# Patient Record
Sex: Male | Born: 1965 | Race: Black or African American | Hispanic: No | Marital: Married | Smoking: Never smoker
Health system: Southern US, Community
[De-identification: ages and names within clinical notes are randomized; demographics above are authoritative.]

## PROBLEM LIST (undated history)

## (undated) DIAGNOSIS — E119 Type 2 diabetes mellitus without complications: Secondary | ICD-10-CM

## (undated) DIAGNOSIS — G4730 Sleep apnea, unspecified: Secondary | ICD-10-CM

## (undated) DIAGNOSIS — E785 Hyperlipidemia, unspecified: Secondary | ICD-10-CM

## (undated) DIAGNOSIS — E669 Obesity, unspecified: Secondary | ICD-10-CM

## (undated) DIAGNOSIS — I10 Essential (primary) hypertension: Secondary | ICD-10-CM

## (undated) DIAGNOSIS — Z8489 Family history of other specified conditions: Secondary | ICD-10-CM

## (undated) HISTORY — DX: Family history of other specified conditions: Z84.89

## (undated) HISTORY — DX: Hyperlipidemia, unspecified: E78.5

## (undated) HISTORY — PX: WISDOM TOOTH EXTRACTION: SHX21

## (undated) HISTORY — DX: Type 2 diabetes mellitus without complications: E11.9

## (undated) HISTORY — DX: Essential (primary) hypertension: I10

## (undated) HISTORY — DX: Obesity, unspecified: E66.9

## (undated) HISTORY — PX: COLONOSCOPY: SHX174

## (undated) HISTORY — DX: Sleep apnea, unspecified: G47.30

---

## 2005-11-18 ENCOUNTER — Ambulatory Visit: Payer: Self-pay | Admitting: Family Medicine

## 2005-11-25 ENCOUNTER — Ambulatory Visit: Payer: Self-pay | Admitting: Family Medicine

## 2005-12-23 ENCOUNTER — Ambulatory Visit: Payer: Self-pay | Admitting: Family Medicine

## 2006-02-02 ENCOUNTER — Ambulatory Visit: Payer: Self-pay | Admitting: Family Medicine

## 2006-02-04 ENCOUNTER — Ambulatory Visit: Payer: Self-pay

## 2006-03-17 ENCOUNTER — Ambulatory Visit: Payer: Self-pay | Admitting: Family Medicine

## 2006-11-18 ENCOUNTER — Ambulatory Visit: Payer: Self-pay | Admitting: Family Medicine

## 2006-11-18 LAB — CONVERTED CEMR LAB
ALT: 25 units/L (ref 0–40)
AST: 27 units/L (ref 0–37)
Albumin: 3.9 g/dL (ref 3.5–5.2)
Alkaline Phosphatase: 76 units/L (ref 39–117)
BUN: 10 mg/dL (ref 6–23)
Basophils Absolute: 0 10*3/uL (ref 0.0–0.1)
Basophils Relative: 0.3 % (ref 0.0–1.0)
Bilirubin, Direct: 0.1 mg/dL (ref 0.0–0.3)
CO2: 32 meq/L (ref 19–32)
Calcium: 9.3 mg/dL (ref 8.4–10.5)
Chloride: 102 meq/L (ref 96–112)
Cholesterol: 218 mg/dL (ref 0–200)
Creatinine, Ser: 1.1 mg/dL (ref 0.4–1.5)
Direct LDL: 158.5 mg/dL
Eosinophils Absolute: 0.3 10*3/uL (ref 0.0–0.6)
Eosinophils Relative: 5.5 % — ABNORMAL HIGH (ref 0.0–5.0)
GFR calc Af Amer: 95 mL/min
GFR calc non Af Amer: 78 mL/min
Glucose, Bld: 94 mg/dL (ref 70–99)
HCT: 40.3 % (ref 39.0–52.0)
HDL: 43.8 mg/dL (ref >39.0)
Hemoglobin: 13.4 g/dL (ref 13.0–17.0)
Lymphocytes Relative: 24.8 % (ref 12.0–46.0)
MCHC: 33.1 g/dL (ref 30.0–36.0)
MCV: 88.7 fL (ref 78.0–100.0)
Monocytes Absolute: 0.6 10*3/uL (ref 0.2–0.7)
Monocytes Relative: 9.6 % (ref 3.0–11.0)
Neutro Abs: 3.7 10*3/uL (ref 1.4–7.7)
Neutrophils Relative %: 59.8 % (ref 43.0–77.0)
Platelets: 279 10*3/uL (ref 150–400)
Potassium: 3.2 meq/L — ABNORMAL LOW (ref 3.5–5.1)
RBC: 4.54 M/uL (ref 4.22–5.81)
RDW: 13.1 % (ref 11.5–14.6)
Sodium: 143 meq/L (ref 135–145)
TSH: 3.24 microintl units/mL (ref 0.35–5.50)
Total Bilirubin: 0.6 mg/dL (ref 0.3–1.2)
Total CHOL/HDL Ratio: 5
Total Protein: 7.5 g/dL (ref 6.0–8.3)
Triglycerides: 71 mg/dL (ref 0–149)
VLDL: 14 mg/dL (ref 0–40)
WBC: 6.1 10*3/uL (ref 4.5–10.5)

## 2006-11-26 ENCOUNTER — Ambulatory Visit: Payer: Self-pay | Admitting: Family Medicine

## 2006-11-26 LAB — CONVERTED CEMR LAB
Hgb A1c MFr Bld: 7.8 % — ABNORMAL HIGH (ref 4.6–6.0)
PSA: 0.68 ng/mL (ref 0.10–4.00)

## 2007-01-28 ENCOUNTER — Ambulatory Visit: Payer: Self-pay | Admitting: Family Medicine

## 2007-01-28 LAB — CONVERTED CEMR LAB
ALT: 26 units/L (ref 0–40)
AST: 23 units/L (ref 0–37)
Albumin: 3.7 g/dL (ref 3.5–5.2)
Alkaline Phosphatase: 82 units/L (ref 39–117)
Bilirubin, Direct: 0.1 mg/dL (ref 0.0–0.3)
Cholesterol: 165 mg/dL (ref 0–200)
Creatinine,U: 147.5 mg/dL
HDL: 39.6 mg/dL (ref >39.0)
LDL Cholesterol: 112 mg/dL — ABNORMAL HIGH (ref 0–99)
Microalb Creat Ratio: 1.4 mg/g (ref 0.0–30.0)
Microalb, Ur: 0.2 mg/dL (ref 0.0–1.9)
Total Bilirubin: 0.6 mg/dL (ref 0.3–1.2)
Total CHOL/HDL Ratio: 4.2
Total Protein: 6.7 g/dL (ref 6.0–8.3)
Triglycerides: 67 mg/dL (ref 0–149)
VLDL: 13 mg/dL (ref 0–40)

## 2007-05-04 ENCOUNTER — Ambulatory Visit: Payer: Self-pay | Admitting: Family Medicine

## 2007-05-04 DIAGNOSIS — E139 Other specified diabetes mellitus without complications: Secondary | ICD-10-CM | POA: Insufficient documentation

## 2007-06-24 DIAGNOSIS — I10 Essential (primary) hypertension: Secondary | ICD-10-CM | POA: Insufficient documentation

## 2008-10-04 ENCOUNTER — Ambulatory Visit: Payer: Self-pay | Admitting: Family Medicine

## 2008-10-04 LAB — CONVERTED CEMR LAB
ALT: 29 units/L (ref 0–53)
Alkaline Phosphatase: 84 units/L (ref 39–117)
Basophils Absolute: 0 10*3/uL (ref 0.0–0.1)
Bilirubin Urine: NEGATIVE
Bilirubin, Direct: 0.1 mg/dL (ref 0.0–0.3)
Blood in Urine, dipstick: NEGATIVE
CO2: 34 meq/L — ABNORMAL HIGH (ref 19–32)
Calcium: 9.2 mg/dL (ref 8.4–10.5)
Chloride: 101 meq/L (ref 96–112)
Cholesterol: 215 mg/dL (ref 0–200)
Direct LDL: 152.9 mg/dL
Glucose, Urine, Semiquant: NEGATIVE
HDL: 42.1 mg/dL (ref >39.0)
Lymphocytes Relative: 26.5 % (ref 12.0–46.0)
MCHC: 34.2 g/dL (ref 30.0–36.0)
Microalb Creat Ratio: 2 mg/g (ref 0.0–30.0)
Microalb, Ur: 0.3 mg/dL (ref 0.0–1.9)
Monocytes Relative: 7.3 % (ref 3.0–12.0)
Neutro Abs: 3.3 10*3/uL (ref 1.4–7.7)
Neutrophils Relative %: 61.3 % (ref 43.0–77.0)
PSA: 0.68 ng/mL (ref 0.10–4.00)
Platelets: 212 10*3/uL (ref 150–400)
Potassium: 3.9 meq/L (ref 3.5–5.1)
Protein, U semiquant: NEGATIVE
RDW: 13.6 % (ref 11.5–14.6)
Sodium: 141 meq/L (ref 135–145)
TSH: 2.29 microintl units/mL (ref 0.35–5.50)
Total Bilirubin: 0.6 mg/dL (ref 0.3–1.2)
Total CHOL/HDL Ratio: 5.1
Triglycerides: 76 mg/dL (ref 0–149)
Urobilinogen, UA: 0.2
VLDL: 15 mg/dL (ref 0–40)
pH: 6

## 2008-10-11 ENCOUNTER — Ambulatory Visit: Payer: Self-pay | Admitting: Family Medicine

## 2008-10-11 DIAGNOSIS — E785 Hyperlipidemia, unspecified: Secondary | ICD-10-CM | POA: Insufficient documentation

## 2009-01-09 ENCOUNTER — Ambulatory Visit: Payer: Self-pay | Admitting: Family Medicine

## 2009-01-09 LAB — CONVERTED CEMR LAB
ALT: 31 units/L (ref 0–53)
AST: 24 units/L (ref 0–37)
Alkaline Phosphatase: 78 units/L (ref 39–117)
Total Bilirubin: 0.5 mg/dL (ref 0.3–1.2)
Total CHOL/HDL Ratio: 4
Triglycerides: 68 mg/dL (ref 0.0–149.0)

## 2009-01-16 ENCOUNTER — Ambulatory Visit: Payer: Self-pay | Admitting: Family Medicine

## 2009-01-29 ENCOUNTER — Telehealth: Payer: Self-pay | Admitting: Family Medicine

## 2009-02-13 ENCOUNTER — Ambulatory Visit: Payer: Self-pay | Admitting: Family Medicine

## 2009-05-16 ENCOUNTER — Ambulatory Visit: Payer: Self-pay | Admitting: Family Medicine

## 2009-05-16 LAB — CONVERTED CEMR LAB
BUN: 12 mg/dL (ref 6–23)
CO2: 30 meq/L (ref 19–32)
Chloride: 109 meq/L (ref 96–112)
Creatinine, Ser: 1.1 mg/dL (ref 0.4–1.5)

## 2009-05-23 ENCOUNTER — Ambulatory Visit: Payer: Self-pay | Admitting: Family Medicine

## 2009-10-11 ENCOUNTER — Ambulatory Visit: Payer: Self-pay | Admitting: Family Medicine

## 2009-10-11 LAB — CONVERTED CEMR LAB
AST: 21 units/L (ref 0–37)
Albumin: 3.8 g/dL (ref 3.5–5.2)
Alkaline Phosphatase: 70 units/L (ref 39–117)
Basophils Absolute: 0 10*3/uL (ref 0.0–0.1)
Blood in Urine, dipstick: NEGATIVE
Calcium: 8.7 mg/dL (ref 8.4–10.5)
Creatinine,U: 152 mg/dL
GFR calc non Af Amer: 104.35 mL/min (ref >60)
Glucose, Bld: 148 mg/dL — ABNORMAL HIGH (ref 70–99)
HDL: 43.4 mg/dL (ref >39.00)
Hemoglobin: 13 g/dL (ref 13.0–17.0)
Hgb A1c MFr Bld: 8.3 % — ABNORMAL HIGH (ref 4.6–6.5)
LDL Cholesterol: 135 mg/dL — ABNORMAL HIGH (ref 0–99)
Lymphocytes Relative: 26.4 % (ref 12.0–46.0)
Microalb Creat Ratio: 2.6 mg/g (ref 0.0–30.0)
Monocytes Relative: 8.8 % (ref 3.0–12.0)
Neutro Abs: 3.3 10*3/uL (ref 1.4–7.7)
Nitrite: NEGATIVE
Protein, U semiquant: NEGATIVE
RBC: 4.38 M/uL (ref 4.22–5.81)
RDW: 13.9 % (ref 11.5–14.6)
Sodium: 135 meq/L (ref 135–145)
Total Bilirubin: 0.5 mg/dL (ref 0.3–1.2)
Total CHOL/HDL Ratio: 4
Urobilinogen, UA: 0.2
VLDL: 16 mg/dL (ref 0.0–40.0)
WBC Urine, dipstick: NEGATIVE

## 2009-10-23 ENCOUNTER — Ambulatory Visit: Payer: Self-pay | Admitting: Family Medicine

## 2009-11-06 ENCOUNTER — Ambulatory Visit: Payer: Self-pay | Admitting: Family Medicine

## 2010-01-29 ENCOUNTER — Ambulatory Visit: Payer: Self-pay | Admitting: Family Medicine

## 2010-01-29 LAB — CONVERTED CEMR LAB
GFR calc non Af Amer: 104.21 mL/min (ref >60)
Hgb A1c MFr Bld: 8.7 % — ABNORMAL HIGH (ref 4.6–6.5)
Potassium: 4.1 meq/L (ref 3.5–5.1)
Sodium: 141 meq/L (ref 135–145)

## 2010-02-05 ENCOUNTER — Ambulatory Visit: Payer: Self-pay | Admitting: Family Medicine

## 2010-02-13 ENCOUNTER — Ambulatory Visit: Payer: Self-pay | Admitting: Family Medicine

## 2010-04-09 LAB — HM DIABETES EYE EXAM

## 2010-04-10 ENCOUNTER — Encounter: Payer: Self-pay | Admitting: Family Medicine

## 2010-05-07 ENCOUNTER — Ambulatory Visit: Payer: Self-pay | Admitting: Family Medicine

## 2010-05-07 LAB — CONVERTED CEMR LAB
Chloride: 103 meq/L (ref 96–112)
Hgb A1c MFr Bld: 6.3 % (ref 4.6–6.5)
Potassium: 4 meq/L (ref 3.5–5.1)
Sodium: 139 meq/L (ref 135–145)

## 2010-05-15 ENCOUNTER — Ambulatory Visit: Payer: Self-pay | Admitting: Family Medicine

## 2010-10-02 ENCOUNTER — Ambulatory Visit
Admission: RE | Admit: 2010-10-02 | Discharge: 2010-10-02 | Payer: Self-pay | Source: Home / Self Care | Attending: Family Medicine | Admitting: Family Medicine

## 2010-10-02 ENCOUNTER — Other Ambulatory Visit: Payer: Self-pay | Admitting: Family Medicine

## 2010-10-02 LAB — MICROALBUMIN / CREATININE URINE RATIO
Creatinine,U: 183.8 mg/dL
Microalb Creat Ratio: 1.2 mg/g (ref 0.0–30.0)
Microalb, Ur: 2.2 mg/dL — ABNORMAL HIGH (ref 0.0–1.9)

## 2010-10-02 LAB — CONVERTED CEMR LAB
Bilirubin Urine: NEGATIVE
Ketones, urine, test strip: NEGATIVE
Protein, U semiquant: NEGATIVE
Urobilinogen, UA: 0.2

## 2010-10-02 LAB — CBC WITH DIFFERENTIAL/PLATELET
Basophils Absolute: 0 10*3/uL (ref 0.0–0.1)
Basophils Relative: 0.2 % (ref 0.0–3.0)
Eosinophils Absolute: 0.3 10*3/uL (ref 0.0–0.7)
Eosinophils Relative: 6.6 % — ABNORMAL HIGH (ref 0.0–5.0)
HCT: 43.2 % (ref 39.0–52.0)
Hemoglobin: 14.5 g/dL (ref 13.0–17.0)
Lymphocytes Relative: 26.2 % (ref 12.0–46.0)
Lymphs Abs: 1.4 10*3/uL (ref 0.7–4.0)
MCHC: 33.6 g/dL (ref 30.0–36.0)
MCV: 89.7 fl (ref 78.0–100.0)
Monocytes Absolute: 0.5 10*3/uL (ref 0.1–1.0)
Monocytes Relative: 9 % (ref 3.0–12.0)
Neutro Abs: 3 10*3/uL (ref 1.4–7.7)
Neutrophils Relative %: 58 % (ref 43.0–77.0)
Platelets: 233 10*3/uL (ref 150.0–400.0)
RBC: 4.82 Mil/uL (ref 4.22–5.81)
RDW: 15 % — ABNORMAL HIGH (ref 11.5–14.6)
WBC: 5.2 10*3/uL (ref 4.5–10.5)

## 2010-10-02 LAB — LIPID PANEL
Cholesterol: 246 mg/dL — ABNORMAL HIGH (ref 0–200)
HDL: 48.2 mg/dL (ref >39.00)
Total CHOL/HDL Ratio: 5
Triglycerides: 68 mg/dL (ref 0.0–149.0)
VLDL: 13.6 mg/dL (ref 0.0–40.0)

## 2010-10-02 LAB — TSH: TSH: 2.78 u[IU]/mL (ref 0.35–5.50)

## 2010-10-02 LAB — HEPATIC FUNCTION PANEL
ALT: 38 U/L (ref 0–53)
AST: 32 U/L (ref 0–37)
Albumin: 3.9 g/dL (ref 3.5–5.2)
Alkaline Phosphatase: 71 U/L (ref 39–117)
Bilirubin, Direct: 0.1 mg/dL (ref 0.0–0.3)
Total Bilirubin: 0.6 mg/dL (ref 0.3–1.2)
Total Protein: 7.5 g/dL (ref 6.0–8.3)

## 2010-10-02 LAB — BASIC METABOLIC PANEL
BUN: 20 mg/dL (ref 6–23)
CO2: 28 mEq/L (ref 19–32)
Calcium: 9.2 mg/dL (ref 8.4–10.5)
Chloride: 103 mEq/L (ref 96–112)
Creatinine, Ser: 1.1 mg/dL (ref 0.4–1.5)
GFR: 98.2 mL/min (ref >60.00)
Glucose, Bld: 95 mg/dL (ref 70–99)
Potassium: 4.5 mEq/L (ref 3.5–5.1)
Sodium: 138 mEq/L (ref 135–145)

## 2010-10-02 LAB — LDL CHOLESTEROL, DIRECT: Direct LDL: 183.5 mg/dL

## 2010-10-02 LAB — HEMOGLOBIN A1C: Hgb A1c MFr Bld: 6.4 % (ref 4.6–6.5)

## 2010-10-08 ENCOUNTER — Encounter: Payer: Self-pay | Admitting: Family Medicine

## 2010-10-08 ENCOUNTER — Ambulatory Visit
Admission: RE | Admit: 2010-10-08 | Discharge: 2010-10-08 | Payer: Self-pay | Source: Home / Self Care | Attending: Family Medicine | Admitting: Family Medicine

## 2010-10-29 NOTE — Assessment & Plan Note (Signed)
Summary: cpx/njr/pt rescd from [facility] per dr//ccm   Vital Signs:  Patient profile:   45 year old male Height:      70.75 inches Weight:      300 pounds BMI:     42.29 Temp:     98.9 degrees F oral BP sitting:   120 / 84  (left arm) Cuff size:   large  Vitals Entered By: Kern Reap CMA Duncan Dull) (October 23, 2009 4:38 PM)  Reason for Visit cpx    History of Present Illness: Leonard Rojas is a 45 year old male, nonsmoker, who comes in today for physical evaluation because of underlying diabetes.  He has been controlling his blood sugar with diet and exercise.  However, recently, he stopped this time.  Weight is up to 300 pounds.  Blood sugar fasting 148 with a hemoglobin A1c of 8.3.  He also has a history of hyperlipidemia.  He got severe muscle pain from Lipitor and Zocor.  He can tolerate 10 mg of Crestor daily.  He stopped taking the Hyzaar because his blood pressure normalized today.  BP 120/84.  His ophthalmologist has left town recommended.  Dr. Vonna Kotyk  he gets routine  dental care.  Tetanus 2004 seasonal flu shot today  Allergies: No Known Drug Allergies  Past History:  Past medical, surgical, family and social histories (including risk factors) reviewed, and no changes noted (except as noted below).  Past Medical History: Reviewed history from 10/11/2008 and no changes required. Obese Allergies +TB Skin Test Diabetes mellitus, type II Hypertension Hyperlipidemia  Past Surgical History: Reviewed history from 06/24/2007 and no changes required. Denies surgical history  Family History: Reviewed history from 06/24/2007 and no changes required. Family History Diabetes 1st degree relative Family History of Prostate CA 1st degree relative <50  Social History: Reviewed history from 06/24/2007 and no changes required. Occupation: Married Never Smoked Alcohol use-yes Drug use-no Regular exercise-no  Review of Systems      See HPI  Physical Exam  General:   Well-developed,well-nourished,in no acute distress; alert,appropriate and cooperative throughout examination Head:  Normocephalic and atraumatic without obvious abnormalities. No apparent alopecia or balding. Eyes:  No corneal or conjunctival inflammation noted. EOMI. Perrla. Funduscopic exam benign, without hemorrhages, exudates or papilledema. Vision grossly normal. Ears:  External ear exam shows no significant lesions or deformities.  Otoscopic examination reveals clear canals, tympanic membranes are intact bilaterally without bulging, retraction, inflammation or discharge. Hearing is grossly normal bilaterally. Nose:  External nasal examination shows no deformity or inflammation. Nasal mucosa are pink and moist without lesions or exudates. Mouth:  Oral mucosa and oropharynx without lesions or exudates.  Teeth in good repair. Neck:  No deformities, masses, or tenderness noted. Chest Wall:  No deformities, masses, tenderness or gynecomastia noted. Breasts:  No masses or gynecomastia noted Lungs:  Normal respiratory effort, chest expands symmetrically. Lungs are clear to auscultation, no crackles or wheezes. Heart:  Normal rate and regular rhythm. S1 and S2 normal without gallop, murmur, click, rub or other extra sounds. Abdomen:  Bowel sounds positive,abdomen soft and non-tender without masses, organomegaly or hernias noted. Rectal:  No external abnormalities noted. Normal sphincter tone. No rectal masses or tenderness. Genitalia:  Testes bilaterally descended without nodularity, tenderness or masses. No scrotal masses or lesions. No penis lesions or urethral discharge. Prostate:  Prostate gland firm and smooth, no enlargement, nodularity, tenderness, mass, asymmetry or induration. Msk:  No deformity or scoliosis noted of thoracic or lumbar spine.   Pulses:  R and L carotid,radial,femoral,dorsalis  pedis and posterior tibial pulses are full and equal bilaterally Extremities:  No clubbing, cyanosis,  edema, or deformity noted with normal full range of motion of all joints.   Neurologic:  No cranial nerve deficits noted. Station and gait are normal. Plantar reflexes are down-going bilaterally. DTRs are symmetrical throughout. Sensory, motor and coordinative functions appear intact.  Diabetes Management Exam:    Foot Exam (with socks and/or shoes not present):       Sensory-Pinprick/Light touch:          Left medial foot (L-4): normal          Left dorsal foot (L-5): normal          Left lateral foot (S-1): normal          Right medial foot (L-4): normal          Right dorsal foot (L-5): normal          Right lateral foot (S-1): normal       Sensory-Monofilament:          Left foot: normal          Right foot: normal       Inspection:          Left foot: normal          Right foot: normal       Nails:          Left foot: normal          Right foot: normal    Eye Exam:       Eye Exam done elsewhere          Date: 10/14/2007          Results: normal          Done by: opth   Impression & Recommendations:  Problem # 1:  HYPERLIPIDEMIA (ICD-272.4) Assessment Improved  The following medications were removed from the medication list:    Crestor 10 Mg Tabs (Rosuvastatin calcium) .Marland Kitchen... 1 tab @ bedtime His updated medication list for this problem includes:    Crestor 10 Mg Tabs (Rosuvastatin calcium) .Marland Kitchen... 1 tab @ bedtime  Orders: Prescription Created Electronically [id number])  Problem # 2:  HYPERTENSION (ICD-401.9) Assessment: Improved  The following medications were removed from the medication list:    Hyzaar 50-12.5 Mg Tabs (Losartan potassium-hctz) .Marland Kitchen... Take 1 tablet by mouth every morning His updated medication list for this problem includes:    Zestril 5 Mg Tabs (Lisinopril) .Marland Kitchen... Take 1 tablet by mouth every morning  Orders: Prescription Created Electronically [id number])  Problem # 3:  DIABETES MELLITUS, TYPE II (ICD-250.00) Assessment: Deteriorated  The following  medications were removed from the medication list:    Hyzaar 50-12.5 Mg Tabs (Losartan potassium-hctz) .Marland Kitchen... Take 1 tablet by mouth every morning His updated medication list for this problem includes:    Metformin Hcl 500 Mg Tabs (Metformin hcl) .Marland Kitchen... Take 1 tablet by mouth two times a day    Zestril 5 Mg Tabs (Lisinopril) .Marland Kitchen... Take 1 tablet by mouth every morning  Orders: Prescription Created Electronically [id number])  Complete Medication List: 1)  Onetouch Ultra Test Strp (Glucose blood) .... Use daily to test glucose 2)  Metformin Hcl 500 Mg Tabs (Metformin hcl) .... Take 1 tablet by mouth two times a day 3)  Zestril 5 Mg Tabs (Lisinopril) .... Take 1 tablet by mouth every morning 4)  Crestor 10 Mg Tabs (Rosuvastatin calcium) .Marland Kitchen.. 1 tab @ bedtime  Patient Instructions: 1)  See your eye doctor yearly to check for diabetic eye damage. 2)  restart your caloric restricted diet, as u did before.  Drank 30 ounces of water daily restart metformin 500 mg twice a day.  Check a fasting blood sugar daily.  Return in two weeks for follow-up. 3)  Call Dr. Gweneth Dimitri for eye exam. 4)  Walk 15 minutes daily 5)  Take Zestril, 5 mg daily for renal protection 6)  Take an Aspirin every day. 7)  Check your blood sugars regularly. If your readings are usually above : or below 70 you should contact our office. 8)  It is important that your Diabetic A1c level is checked every 3 months. 9)  See your eye doctor yearly to check for diabetic eye damage. 10)  Check your feet each night for sore areas, calluses or signs of infection. 11)  Check your Blood Pressure regularly. If it is above: you should make an appointment. Prescriptions: ONETOUCH ULTRA TEST  STRP (GLUCOSE BLOOD) use daily to test glucose  #100 x 3   Entered and Authorized by:   Roderick Pee MD   Signed by:   Roderick Pee MD on 10/23/2009   Method used:   Electronically to        [facility]  [address] [phone]* (retail)       [address]        [place], [place]  [address]       Ph: [phone] or [phone]       Fax: [phone]   RxID:   [id number] CRESTOR 10 MG TABS (ROSUVASTATIN CALCIUM) 1 tab @ bedtime  #100 x 3   Entered and Authorized by:   Roderick Pee MD   Signed by:   Roderick Pee MD on 10/23/2009   Method used:   Electronically to        [facility]  [address] [phone]* (retail)       [address]       [place], [place]  [address]       Ph: [phone] or [phone]       Fax: [phone]   RxID:   [id number] ZESTRIL 5 MG TABS (LISINOPRIL) Take 1 tablet by mouth every morning  #100 x 3   Entered and Authorized by:   Roderick Pee MD   Signed by:   Roderick Pee MD on 10/23/2009   Method used:   [facility]ronically to        [facility]  [address] [phone]* (retail)       [address]       [place], [place]  [address]       Ph: [phone] or [phone]       Fax: [phone]   RxID:   [id number] METFORMIN HCL 500 MG TABS (METFORMIN HCL) Take 1 tablet by mouth two times a day  #200 x 3   Entered and Authorized by:   Roderick Pee MD   Signed by:   Roderick Pee MD on 10/23/2009   Method used:   [facility]ronically to        [facility]  [address] [phone]* (retail)       [address]       [place], [place]  [address]  Ph: [phone] or [phone]       Fax: [phone]   RxID:   [id number]     Appended Document: cpx/njr/pt rescd from [facility] per dr//ccm     Allergies: No Known Drug Allergies  Review of Systems       Flu Vaccine Consent Questions     Do you have a history of severe allergic reactions to this vaccine? no    Any prior history of allergic reactions to egg and/or gelatin? no    Do you have a sensitivity to the preservative Thimersol? no    Do you have a past history of Guillan-Barre Syndrome? no    Do you currently have an acute febrile illness? no    Have you ever had a severe reaction to latex? no     Vaccine information given and explained to patient? yes    Are you currently pregnant? no    Lot Number:[id number]   Exp Date:03/28/2010   Site Given  Left Deltoid IM    Complete Medication List: 1)  Onetouch Ultra Test Strp (Glucose blood) .... Use daily to test glucose 2)  Metformin Hcl 500 Mg Tabs (Metformin hcl) .... Take 1 tablet by mouth two times a day 3)  Zestril 5 Mg Tabs (Lisinopril) .... Take 1 tablet by mouth every morning 4)  Crestor 10 Mg Tabs (Rosuvastatin calcium) .Marland Kitchen.. 1 tab @ bedtime  Other Orders: Admin 1st Vaccine (31517) Flu Vaccine 37yrs + ([address]07) EKG w/ Interpretation (93000)

## 2010-10-29 NOTE — Assessment & Plan Note (Signed)
Summary: 1 week fup//ccm   Vital Signs:  Patient profile:   45 year old male Weight:      296 pounds Temp:     98.0 degrees F oral BP sitting:   108 / 80  (left arm) Cuff size:   large  Vitals Entered By: Kern Reap CMA Duncan Dull) (Feb 13, 2010 8:40 AM) CC: 1 week follow up   CC:  1 week follow up.  History of Present Illness: Oday is a 45 year old male type II diabetic, who comes in today for follow-up.  His metformin doses 500 mg b.i.d.  He had a fasting blood sugar two weeks ago.  113, however, A1c was 8.  In the, meantime he's been checking his blood sugar two to 3 times a day and are all under 120.  He is got died at exercise and this is why his blood sugar has dropped to normal.  No hypoglycemia  Allergies (verified): No Known Drug Allergies  Social History: Reviewed history from 06/24/2007 and no changes required. Occupation: Married Never Smoked Alcohol use-yes Drug use-no Regular exercise-no  Review of Systems      See HPI  Physical Exam  General:  Well-developed,well-nourished,in no acute distress; alert,appropriate and cooperative throughout examination   Impression & Recommendations:  Problem # 1:  DIABETES MELLITUS, TYPE II (ICD-250.00) Assessment Improved  The following medications were removed from the medication list:    Glucophage 1000 Mg Tabs (Metformin hcl) .Marland Kitchen... Take 1 tablet by mouth every morning His updated medication list for this problem includes:    Metformin Hcl 500 Mg Tabs (Metformin hcl) .Marland Kitchen... Take 1 tablet by mouth two times a day    Zestril 5 Mg Tabs (Lisinopril) .Marland Kitchen... Take 1 tablet by mouth every morning  Complete Medication List: 1)  Onetouch Ultra Test Strp (Glucose blood) .... Use daily to test glucose 2)  Metformin Hcl 500 Mg Tabs (Metformin hcl) .... Take 1 tablet by mouth two times a day 3)  Zestril 5 Mg Tabs (Lisinopril) .... Take 1 tablet by mouth every morning 4)  Crestor 10 Mg Tabs (Rosuvastatin calcium) .Marland Kitchen.. 1 tab @  bedtime  Patient Instructions: 1)  continue your current treatment program.  Check a fasting blood sugar once daily in the morning. 2)  Please schedule a follow-up appointment in 3 months. 3)  See your eye doctor yearly to check for diabetic eye damage. 4)  BMP prior to visit, ICD-9: 5)  HbgA1C prior to visit, ICD-9:

## 2010-10-29 NOTE — Assessment & Plan Note (Signed)
Summary: 3 mo rov/mm   Vital Signs:  Patient profile:   45 year old male Weight:      295 pounds Temp:     98.2 degrees F oral BP sitting:   110 / 80  (left arm) CC: FU / A1C    CC:  FU / A1C.  History of Present Illness: Leonard Rojas is a 45 year old, married male, nonsmoker, with underlying type 2 diabetes, who comes in today for follow-up.  His fasting blood sugar is around 120 to 130 however, his hemoglobin A1c is 8.7.  In the last 3 weeks since he joined, [employer] she's lost 5 pounds and is drastically altered his diet.  Prior to that he was eating exclusively fast food.  Blood pressure 110/80.  Weight 295.  He was advised to get his annual eye exam.  Denies any blurring of his vision  Allergies: No Known Drug Allergies  Past History:  Past medical, surgical, family and social histories (including risk factors) reviewed for relevance to current acute and chronic problems.  Past Medical History: Reviewed history from 10/11/2008 and no changes required. Obese Allergies +TB Skin Test Diabetes mellitus, type II Hypertension Hyperlipidemia  Past Surgical History: Reviewed history from 06/24/2007 and no changes required. Denies surgical history  Family History: Reviewed history from 06/24/2007 and no changes required. Family History Diabetes 1st degree relative Family History of Prostate CA 1st degree relative <50  Social History: Reviewed history from 06/24/2007 and no changes required. Occupation: Married Never Smoked Alcohol use-yes Drug use-no Regular exercise-no  Review of Systems      See HPI  Physical Exam  General:  Well-developed,well-nourished,in no acute distress; alert,appropriate and cooperative throughout examination   Impression & Recommendations:  Problem # 1:  DIABETES MELLITUS, TYPE II (ICD-250.00) Assessment Deteriorated  His updated medication list for this problem includes:    Metformin Hcl 500 Mg Tabs (Metformin hcl) .Marland Kitchen... Take  1 tablet by mouth two times a day    Zestril 5 Mg Tabs (Lisinopril) .Marland Kitchen... Take 1 tablet by mouth every morning    Glucophage 1000 Mg Tabs (Metformin hcl) .Marland Kitchen... Take 1 tablet by mouth every morning  Orders: Prescription Created Electronically [phone])  Complete Medication List: 1)  Onetouch Ultra Test Strp (Glucose blood) .... Use daily to test glucose 2)  Metformin Hcl 500 Mg Tabs (Metformin hcl) .... Take 1 tablet by mouth two times a day 3)  Zestril 5 Mg Tabs (Lisinopril) .... Take 1 tablet by mouth every morning 4)  Crestor 10 Mg Tabs (Rosuvastatin calcium) .Marland Kitchen.. 1 tab @ bedtime 5)  Glucophage 1000 Mg Tabs (Metformin hcl) .... Take 1 tablet by mouth every morning  Patient Instructions: 1)  increase the morning dose of Glucophage to 1000 mg and keep the evening.  This is same at 500. 2)  Check a blood sugar in the morning, noon, and  prior to evening meal...........Marland Kitchenreturn in one week for follow-up Prescriptions: GLUCOPHAGE 1000 MG TABS (METFORMIN HCL) Take 1 tablet by mouth every morning  #100 x 3   Entered and Authorized by:   Roderick Pee MD   Signed by:   Roderick Pee MD on 02/05/2010   Method used:   [facility]ron[facility] to        [facility]  [address] [phone]* (retail)       [address]       [place], [place]  [address]  Ph: [phone] or [phone]       Fax: [phone]   RxID:   [id number]

## 2010-10-29 NOTE — Assessment & Plan Note (Signed)
Summary: 3 month fup//ccm   Vital Signs:  Patient profile:   45 year old male Weight:      275 pounds Temp:     98.3 degrees F oral BP sitting:   110 / 80  (left arm) Cuff size:   regular  Vitals Entered By: Kern Reap CMA Duncan Dull) (May 15, 2010 8:31 AM)  CC: follow-up visit   CC:  follow-up visit.  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  In May, his hemoglobin A1c was 8.6%.  We talked about various options.  He elected to try diet and exercise.  He's lost 20 pounds and he stopped his medication and his blood sugar now is normal and his A1c is 6.3%.  He's been off the metformin now for 3 months.  Is also stop the Crestor because of muscle pain.  He states he tried Zocor, Lipitor, all these medications caused muscle pain.  We will get a follow-up lipid panel in 3 months.  He continues to only take Zestril 5 mg daily.  BP 110/80.  Allergies (verified): No Known Drug Allergies  Past History:  Past medical, surgical, family and social histories (including risk factors) reviewed for relevance to current acute and chronic problems.  Past Medical History: Reviewed history from 10/11/2008 and no changes required. Obese Allergies +TB Skin Test Diabetes mellitus, type II Hypertension Hyperlipidemia  Past Surgical History: Reviewed history from 06/24/2007 and no changes required. Denies surgical history  Family History: Reviewed history from 06/24/2007 and no changes required. Family History Diabetes 1st degree relative Family History of Prostate CA 1st degree relative <50  Social History: Reviewed history from 06/24/2007 and no changes required. Occupation: Married Never Smoked Alcohol use-yes Drug use-no Regular exercise-no  Review of Systems      See HPI  Physical Exam  General:  Well-developed,well-nourished,in no acute distress;  alert,appropriate and cooperative throughout examination   Impression & Recommendations:  Problem # 1:  HYPERLIPIDEMIA (ICD-272.4) Assessment Improved  The following medications were removed from the medication list:    Crestor 10 Mg Tabs (Rosuvastatin calcium) .Marland Kitchen... 1 tab @ bedtime  Problem # 2:  DIABETES MELLITUS, TYPE II (ICD-250.00) Assessment: Improved  The following medications were removed from the medication list:    Metformin Hcl 500 Mg Tabs (Metformin hcl) .Marland Kitchen... Take 1 tablet by mouth two times a day His updated medication list for this problem includes:    Zestril 5 Mg Tabs (Lisinopril) .Marland Kitchen... 1/2 qam  Problem # 3:  HYPERTENSION (ICD-401.9) Assessment: Improved  His updated medication list for this problem includes:    Zestril 5 Mg Tabs (Lisinopril) .Marland Kitchen... 1/2 qam  Complete Medication List: 1)  Onetouch Ultra Test Strp (Glucose blood) .... Use daily to test glucose 2)  Zestril 5 Mg Tabs (Lisinopril) .... 1/2 qam  Patient Instructions: 1)  check a fasting blood sugar, Monday, Wednesday, Friday, and decrease the Zestril to a half of a 5-mg q.a.m. 2)  Please schedule a follow-up appointment in 3 months.   ....250.00 3)  BMP prior to visit, ICD-9: 4)  Lipid Panel prior to visit, ICD-9: 5)  HbgA1C prior to visit, ICD-9: Prescriptions: ZESTRIL 5 MG TABS (LISINOPRIL) 1/2 qam  #50 x 3   Entered and Authorized by:   Roderick Pee MD   Signed by:   Roderick Pee MD on 05/15/2010   Method used:   [facility] to  [facility]  [address] [phone]* (retail)       [address]       [place], [place]  [address]       Ph: [phone] or [phone]       Fax: [phone]   RxID:   [id number]

## 2010-10-29 NOTE — Assessment & Plan Note (Signed)
Summary: 2 wk rov/njr   Vital Signs:  Patient profile:   45 year old male Weight:      300 pounds Temp:     99.1 degrees F oral BP sitting:   120 / 80  (left arm) Cuff size:   large  Vitals Entered By: Kern Reap CMA Duncan Dull) (November 06, 2009 3:11 PM)  Reason for Visit follow up glucose  History of Present Illness: Leonard Rojas is a 45 year old male, married, nonsmoker, who comes in today for evaluation of diabetes, new onset type II.  Two weeks ago.  We started him on metformin 500 b.i.d. blood sugars now are averaging right around 100.  No hypoglycemia.  We also added Zestril 5 mg daily for renal protection.  Allergies: No Known Drug Allergies  Past History:  Past medical, surgical, family and social histories (including risk factors) reviewed for relevance to current acute and chronic problems.  Past Medical History: Reviewed history from 10/11/2008 and no changes required. Obese Allergies +TB Skin Test Diabetes mellitus, type II Hypertension Hyperlipidemia  Past Surgical History: Reviewed history from 06/24/2007 and no changes required. Denies surgical history  Family History: Reviewed history from 06/24/2007 and no changes required. Family History Diabetes 1st degree relative Family History of Prostate CA 1st degree relative <50  Social History: Reviewed history from 06/24/2007 and no changes required. Occupation: Married Never Smoked Alcohol use-yes Drug use-no Regular exercise-no  Review of Systems      See HPI  Physical Exam  General:  Well-developed,well-nourished,in no acute distress; alert,appropriate and cooperative throughout examination   Impression & Recommendations:  Problem # 1:  DIABETES MELLITUS, TYPE II (ICD-250.00) Assessment Improved  His updated medication list for this problem includes:    Metformin Hcl 500 Mg Tabs (Metformin hcl) .Marland Kitchen... Take 1 tablet by mouth two times a day    Zestril 5 Mg Tabs (Lisinopril) .Marland Kitchen... Take 1 tablet  by mouth every morning  Complete Medication List: 1)  Onetouch Ultra Test Strp (Glucose blood) .... Use daily to test glucose 2)  Metformin Hcl 500 Mg Tabs (Metformin hcl) .... Take 1 tablet by mouth two times a day 3)  Zestril 5 Mg Tabs (Lisinopril) .... Take 1 tablet by mouth every morning 4)  Crestor 10 Mg Tabs (Rosuvastatin calcium) .Marland Kitchen.. 1 tab @ bedtime  Patient Instructions: 1)  continue current medications.  Remember to walk 20 minutes daily. 2)  If we need to fine tune your medication call Fleet Contras and leave her voicemail. 3)  Please schedule a follow-up appointment in 3 months......250.00 4)  BMP prior to visit, ICD-9: 5)  HbgA1C prior to visit, ICD-9:

## 2010-10-29 NOTE — Miscellaneous (Signed)
Summary: eye exam update  Clinical Lists Changes  Observations: Added new observation of EYES COMMENT: 03/2011 (04/10/2010 9:10) Added new observation of EYE EXAM BY: sally miller (04/02/2010 9:11) Added new observation of DMEYEEXMRES: normal (04/02/2010 9:11) Added new observation of DIAB EYE EX: normal (04/02/2010 9:11)      Diabetes Management History:      He says that he is not exercising regularly.    Diabetes Management Exam:    Eye Exam:       Eye Exam done elsewhere          Date: 04/02/2010          Results: normal          Done by: Blima Ledger

## 2010-10-31 NOTE — Assessment & Plan Note (Signed)
Summary: 3 month fup//ccm----PT [facility] // RS   Vital Signs:  Patient profile:   45 year old male Height:      70.75 inches Weight:      285 pounds BMI:     40.18 Temp:     98.8 degrees F oral BP sitting:   120 / 90  (left arm) Cuff size:   regular  Vitals Entered By: Kern Reap CMA Duncan Dull) (October 08, 2010 3:00 PM)  CC: cpx Is Patient Diabetic? Yes Did you bring your meter with you today? No Pain Assessment Patient in pain? no        CC:  cpx.  History of Present Illness: Leonard Rojas is a 45 year old, married male, nonsmoker, who comes in today for general medical examination.  In the, past.  He's had elevated blood sugars.  Blood sugar now 95.  A1c6.5.  BP at home 120/80.  He stopped the Zestril because his blood pressure had dropped to low.  Review of systems otherwise negative.  Tetanus booster 2004 seasonal flu shot 2011  Allergies: No Known Drug Allergies  Past History:  Past medical, surgical, family and social histories (including risk factors) reviewed, and no changes noted (except as noted below).  Past Medical History: Reviewed history from 10/11/2008 and no changes required. Obese Allergies +TB Skin Test Diabetes mellitus, type II Hypertension Hyperlipidemia  Past Surgical History: Reviewed history from 06/24/2007 and no changes required. Denies surgical history  Family History: Reviewed history from 06/24/2007 and no changes required. Family History Diabetes 1st degree relative Family History of Prostate CA 1st degree relative <50  Social History: Reviewed history from 06/24/2007 and no changes required. Occupation: Married Never Smoked Alcohol use-yes Drug use-no Regular exercise-no  Review of Systems      See HPI  Physical Exam  General:  Well-developed,well-nourished,in no acute distress; alert,appropriate and cooperative throughout examination Head:  Normocephalic and atraumatic without obvious abnormalities. No apparent alopecia or  balding. Eyes:  No corneal or conjunctival inflammation noted. EOMI. Perrla. Funduscopic exam benign, without hemorrhages, exudates or papilledema. Vision grossly normal. Ears:  External ear exam shows no significant lesions or deformities.  Otoscopic examination reveals clear canals, tympanic membranes are intact bilaterally without bulging, retraction, inflammation or discharge. Hearing is grossly normal bilaterally. Nose:  External nasal examination shows no deformity or inflammation. Nasal mucosa are pink and moist without lesions or exudates. Mouth:  Oral mucosa and oropharynx without lesions or exudates.  Teeth in good repair. Neck:  No deformities, masses, or tenderness noted. Chest Wall:  No deformities, masses, tenderness or gynecomastia noted. Breasts:  No masses or gynecomastia noted Lungs:  Normal respiratory effort, chest expands symmetrically. Lungs are clear to auscultation, no crackles or wheezes. Heart:  Normal rate and regular rhythm. S1 and S2 normal without gallop, murmur, click, rub or other extra sounds. Abdomen:  Bowel sounds positive,abdomen soft and non-tender without masses, organomegaly or hernias noted. Rectal:  No external abnormalities noted. Normal sphincter tone. No rectal masses or tenderness. Genitalia:  Testes bilaterally descended without nodularity, tenderness or masses. No scrotal masses or lesions. No penis lesions or urethral discharge. Prostate:  Prostate gland firm and smooth, no enlargement, nodularity, tenderness, mass, asymmetry or induration. Msk:  No deformity or scoliosis noted of thoracic or lumbar spine.   Pulses:  R and L carotid,radial,femoral,dorsalis pedis and posterior tibial pulses are full and equal bilaterally Extremities:  No clubbing, cyanosis, edema, or deformity noted with normal full range of motion of all joints.   Neurologic:  No cranial nerve deficits noted. Station and gait are normal. Plantar reflexes are down-going bilaterally.  DTRs are symmetrical throughout. Sensory, motor and coordinative functions appear intact. Skin:  Intact without suspicious lesions or rashes Cervical Nodes:  No lymphadenopathy noted Axillary Nodes:  No palpable lymphadenopathy Inguinal Nodes:  No significant adenopathy Psych:  Cognition and judgment appear intact. Alert and cooperative with normal attention span and concentration. No apparent delusions, illusions, hallucinations  Diabetes Management Exam:    Foot Exam (with socks and/or shoes not present):       Sensory-Pinprick/Light touch:          Left medial foot (L-4): normal          Left dorsal foot (L-5): normal          Left lateral foot (S-1): normal          Right medial foot (L-4): normal          Right dorsal foot (L-5): normal          Right lateral foot (S-1): normal       Sensory-Monofilament:          Left foot: normal          Right foot: normal       Inspection:          Left foot: normal          Right foot: normal       Nails:          Left foot: normal          Right foot: normal    Eye Exam:       Eye Exam done elsewhere          Date: 04/09/2010          Results: normal          Done by: opth   Impression & Recommendations:  Problem # 1:  HYPERLIPIDEMIA (ICD-272.4) Assessment Improved  Orders: EKG w/ Interpretation (93000)  Problem # 2:  Preventive Health Care (ICD-V70.0) Assessment: Unchanged  Problem # 3:  DIABETES MELLITUS, TYPE II (ICD-250.00) Assessment: Improved  The following medications were removed from the medication list:    Zestril 5 Mg Tabs (Lisinopril) .Marland Kitchen... 1/2 qam  Complete Medication List: 1)  Onetouch Ultra Test Strp (Glucose blood) .... Use daily to test glucose  Patient Instructions: 1)  resume your diet and exercise program. 2)  Please schedule a follow-up appointment in 3 months.....250.00,272.00 3)  BMP prior to visit, ICD-9: 4)  Lipid Panel prior to visit, ICD-9: 5)  HbgA1C prior to visit, ICD-9: 6)  Take an  Aspirin every day.   Orders Added: 1)  Est. Patient 40-64 years [99396] 2)  EKG w/ Interpretation [93000]

## 2011-01-07 ENCOUNTER — Other Ambulatory Visit (INDEPENDENT_AMBULATORY_CARE_PROVIDER_SITE_OTHER): Payer: Federal, State, Local not specified - PPO | Admitting: Family Medicine

## 2011-01-07 DIAGNOSIS — E119 Type 2 diabetes mellitus without complications: Secondary | ICD-10-CM

## 2011-01-07 DIAGNOSIS — E785 Hyperlipidemia, unspecified: Secondary | ICD-10-CM

## 2011-01-07 LAB — LIPID PANEL
HDL: 52.2 mg/dL (ref >39.00)
Total CHOL/HDL Ratio: 4
VLDL: 15.4 mg/dL (ref 0.0–40.0)

## 2011-01-07 LAB — BASIC METABOLIC PANEL
CO2: 30 mEq/L (ref 19–32)
Chloride: 103 mEq/L (ref 96–112)
Glucose, Bld: 103 mg/dL — ABNORMAL HIGH (ref 70–99)
Potassium: 4.7 mEq/L (ref 3.5–5.1)
Sodium: 142 mEq/L (ref 135–145)

## 2011-01-07 LAB — LDL CHOLESTEROL, DIRECT: Direct LDL: 155.7 mg/dL

## 2011-01-07 LAB — HEMOGLOBIN A1C: Hgb A1c MFr Bld: 7.1 % — ABNORMAL HIGH (ref 4.6–6.5)

## 2011-01-13 ENCOUNTER — Encounter: Payer: Self-pay | Admitting: Family Medicine

## 2011-01-14 ENCOUNTER — Ambulatory Visit (INDEPENDENT_AMBULATORY_CARE_PROVIDER_SITE_OTHER): Payer: Federal, State, Local not specified - PPO | Admitting: Family Medicine

## 2011-01-14 ENCOUNTER — Encounter: Payer: Self-pay | Admitting: Family Medicine

## 2011-01-14 DIAGNOSIS — E785 Hyperlipidemia, unspecified: Secondary | ICD-10-CM

## 2011-01-14 DIAGNOSIS — E119 Type 2 diabetes mellitus without complications: Secondary | ICD-10-CM

## 2011-01-14 DIAGNOSIS — I10 Essential (primary) hypertension: Secondary | ICD-10-CM

## 2011-01-14 MED ORDER — ATORVASTATIN CALCIUM 10 MG PO TABS: 10.0000 mg | ORAL_TABLET | Freq: Every day | ORAL | Status: DC

## 2011-01-14 MED ORDER — METFORMIN HCL 500 MG PO TABS
ORAL_TABLET | ORAL | Status: DC
Start: 2011-01-14 — End: 2011-09-18

## 2011-01-14 MED ORDER — LISINOPRIL 10 MG PO TABS: 10.0000 mg | ORAL_TABLET | Freq: Every day | ORAL | Status: DC

## 2011-01-14 NOTE — Patient Instructions (Signed)
Begin the 10 mg of the ACE inhibitor daily in the morning,,,,,,,,,,,, remember potential complications of hives, and cough.  Baby aspirin, one nightly with your Lipitor, 10 mg.  Metformin 500 mg,,,,,,,,,,,,, one half tablet daily in the morning prior to breakfast.  Check a fasting blood sugar daily.  Restart your diet and exercise program.  Follow-up in two months fasting labs one week prior

## 2011-01-14 NOTE — Progress Notes (Signed)
  Subjective:    Patient ID: Leonard Rojas, male    DOB: [birth year], 45 y.o.   MRN: [id number]  HPIThomas is a 45 year old, married male, nonsmoker, who comes in today for follow-up of the metabolic syndrome.  His fasting blood sugar now is 103, and A1c7.1.  Lipids are elevated with a LDL of 157 and his BP diastolic jumped to 90 systolic 130.  He, states he's not fine.  His diet and exercise program, which in the past controlled all his metabolic parameters.  However, I recommend we get him started on medication.      Review of Systems General and metabolic review of systems otherwise negative    Objective:   Physical Exam Well-developed well-nourished, male in no acute distress       Assessment & Plan:  Diabetes type II.  Begin metformin 500 mg prior to breakfast.  Hypertension.  Begin ACE inhibitor 10 mg q.a.m.  Hyperlipidemia.  Lipitor 10 nightly with a baby aspirin, followed metabolic parameters and two months

## 2011-03-12 ENCOUNTER — Other Ambulatory Visit: Payer: Federal, State, Local not specified - PPO

## 2011-03-18 ENCOUNTER — Ambulatory Visit: Payer: Federal, State, Local not specified - PPO | Admitting: Family Medicine

## 2011-07-01 ENCOUNTER — Other Ambulatory Visit: Payer: Federal, State, Local not specified - PPO

## 2011-07-08 ENCOUNTER — Ambulatory Visit: Payer: Federal, State, Local not specified - PPO | Admitting: Family Medicine

## 2011-09-04 ENCOUNTER — Other Ambulatory Visit (INDEPENDENT_AMBULATORY_CARE_PROVIDER_SITE_OTHER): Payer: BC Managed Care – PPO

## 2011-09-04 DIAGNOSIS — Z0000 Encounter for general adult medical examination without abnormal findings: Secondary | ICD-10-CM

## 2011-09-04 LAB — POCT URINALYSIS DIPSTICK
Bilirubin, UA: NEGATIVE
Blood, UA: NEGATIVE
Ketones, UA: NEGATIVE
Nitrite, UA: NEGATIVE
Spec Grav, UA: 1.02
pH, UA: 5.5

## 2011-09-04 LAB — PSA: PSA: 0.65 ng/mL (ref 0.10–4.00)

## 2011-09-04 LAB — LIPID PANEL
Cholesterol: 218 mg/dL — ABNORMAL HIGH (ref 0–200)
Triglycerides: 94 mg/dL (ref 0.0–149.0)

## 2011-09-04 LAB — HEPATIC FUNCTION PANEL
Alkaline Phosphatase: 88 U/L (ref 39–117)
Bilirubin, Direct: 0.1 mg/dL (ref 0.0–0.3)
Total Bilirubin: 0.3 mg/dL (ref 0.3–1.2)
Total Protein: 7.1 g/dL (ref 6.0–8.3)

## 2011-09-04 LAB — MICROALBUMIN / CREATININE URINE RATIO
Creatinine,U: 153.7 mg/dL
Microalb Creat Ratio: 0.3 mg/g (ref 0.0–30.0)
Microalb, Ur: 0.4 mg/dL (ref 0.0–1.9)

## 2011-09-04 LAB — CBC WITH DIFFERENTIAL/PLATELET
Basophils Relative: 0.2 % (ref 0.0–3.0)
Eosinophils Absolute: 0.3 10*3/uL (ref 0.0–0.7)
Lymphocytes Relative: 21.3 % (ref 12.0–46.0)
MCHC: 33.1 g/dL (ref 30.0–36.0)
Monocytes Absolute: 0.4 10*3/uL (ref 0.1–1.0)
Neutrophils Relative %: 68 % (ref 43.0–77.0)
Platelets: 223 10*3/uL (ref 150.0–400.0)
RBC: 4.58 Mil/uL (ref 4.22–5.81)
WBC: 6.8 10*3/uL (ref 4.5–10.5)

## 2011-09-04 LAB — BASIC METABOLIC PANEL
BUN: 13 mg/dL (ref 6–23)
CO2: 27 mEq/L (ref 19–32)
Calcium: 8.9 mg/dL (ref 8.4–10.5)
Creatinine, Ser: 1 mg/dL (ref 0.4–1.5)

## 2011-09-04 LAB — LDL CHOLESTEROL, DIRECT: Direct LDL: 157.2 mg/dL

## 2011-09-18 ENCOUNTER — Ambulatory Visit (INDEPENDENT_AMBULATORY_CARE_PROVIDER_SITE_OTHER): Payer: BC Managed Care – PPO | Admitting: Family Medicine

## 2011-09-18 ENCOUNTER — Encounter: Payer: Self-pay | Admitting: Family Medicine

## 2011-09-18 DIAGNOSIS — Z23 Encounter for immunization: Secondary | ICD-10-CM

## 2011-09-18 DIAGNOSIS — E785 Hyperlipidemia, unspecified: Secondary | ICD-10-CM

## 2011-09-18 DIAGNOSIS — I10 Essential (primary) hypertension: Secondary | ICD-10-CM

## 2011-09-18 DIAGNOSIS — E119 Type 2 diabetes mellitus without complications: Secondary | ICD-10-CM

## 2011-09-18 MED ORDER — GLUCOSE BLOOD VI STRP: ORAL_STRIP | Status: AC

## 2011-09-18 MED ORDER — ATORVASTATIN CALCIUM 10 MG PO TABS: 10.0000 mg | ORAL_TABLET | Freq: Every day | ORAL | Status: DC

## 2011-09-18 MED ORDER — METFORMIN HCL 500 MG PO TABS: ORAL_TABLET | ORAL | Status: DC

## 2011-09-18 MED ORDER — LISINOPRIL 10 MG PO TABS: 10.0000 mg | ORAL_TABLET | Freq: Every day | ORAL | Status: DC

## 2011-09-18 NOTE — Progress Notes (Signed)
  Subjective:    Patient ID: Leonard Rojas, male    DOB: 1966-08-12, 45 y.o.   MRN: [id number]  HPI Leonard Rojas is a 45 year old, married male, nonsmoker, who comes in today for evaluation of diabetes, hyperlipidemia, and hypertension.  His weight is up to 301 pounds, and he has stopped taking his medicine 3 months ago.  His fasting blood sugar is 188 with an A1c of 11%.  Microalbumin is negative.  Blood pressure is normal at 118/80.   Review of Systems  Constitutional: Negative.   HENT: Negative.   Eyes: Negative.   Respiratory: Negative.   Cardiovascular: Negative.   Gastrointestinal: Negative.   Genitourinary: Negative.   Musculoskeletal: Negative.   Skin: Negative.   Neurological: Negative.   Hematological: Negative.   Psychiatric/Behavioral: Negative.        Objective:   Physical Exam  Constitutional: He is oriented to person, place, and time. He appears well-developed and well-nourished.  HENT:  Head: Normocephalic and atraumatic.  Right Ear: External ear normal.  Left Ear: External ear normal.  Nose: Nose normal.  Mouth/Throat: Oropharynx is clear and moist.  Eyes: Conjunctivae and EOM are normal. Pupils are equal, round, and reactive to light.       Some blurred vision  Neck: Normal range of motion. Neck supple. No JVD present. No tracheal deviation present. No thyromegaly present.  Cardiovascular: Normal rate, regular rhythm, normal heart sounds and intact distal pulses.  Exam reveals no gallop and no friction rub.   No murmur heard. Pulmonary/Chest: Effort normal and breath sounds normal. No stridor. No respiratory distress. He has no wheezes. He has no rales. He exhibits no tenderness.  Abdominal: Soft. Bowel sounds are normal. He exhibits no distension and no mass. There is no tenderness. There is no rebound and no guarding.  Genitourinary: Rectum normal, prostate normal and penis normal. Guaiac negative stool. No penile tenderness.  Musculoskeletal: Normal range of  motion. He exhibits no edema and no tenderness.  Lymphadenopathy:    He has no cervical adenopathy.  Neurological: He is alert and oriented to person, place, and time. He has normal reflexes. No cranial nerve deficit. He exhibits normal muscle tone.       No neuropathy  Skin: Skin is warm and dry. No rash noted. No erythema. No pallor.  Psychiatric: He has a normal mood and affect. His behavior is normal. Judgment and thought content normal.          Assessment & Plan:  Diabetes type II.  Plan restart medication and emphasized the importance of taking medication on a daily basis.  In the future.  Careful follow-up

## 2011-09-18 NOTE — Patient Instructions (Signed)
start your medication today.  Check a fasting blood sugar daily in the morning.  Return in 3 weeks for follow-up with all your blood sugar readings

## 2011-10-09 ENCOUNTER — Ambulatory Visit: Payer: BC Managed Care – PPO | Admitting: Family Medicine

## 2011-10-14 ENCOUNTER — Encounter: Payer: Self-pay | Admitting: Family Medicine

## 2011-10-14 ENCOUNTER — Ambulatory Visit (INDEPENDENT_AMBULATORY_CARE_PROVIDER_SITE_OTHER): Payer: Federal, State, Local not specified - PPO | Admitting: Family Medicine

## 2011-10-14 DIAGNOSIS — F988 Other specified behavioral and emotional disorders with onset usually occurring in childhood and adolescence: Secondary | ICD-10-CM | POA: Insufficient documentation

## 2011-10-14 DIAGNOSIS — E119 Type 2 diabetes mellitus without complications: Secondary | ICD-10-CM

## 2011-10-14 MED ORDER — AMPHETAMINE-DEXTROAMPHETAMINE 10 MG PO TABS: 10.0000 mg | ORAL_TABLET | Freq: Two times a day (BID) | ORAL | Status: DC

## 2011-10-14 NOTE — Progress Notes (Signed)
  Subjective:    Patient ID: Leonard Rojas, male    DOB: 23-Sep-1966, 46 y.o.   MRN: [id number]  HPI Lewi is a [age]-year-old, married male, nonsmoker, who comes in today for follow-up of two problems.  We saw him a couple weeks ago, and his blood sugar was elevated in the 200 range.  We started him on metformin 500 mg daily, and he's increased the dose to 500 mg twice daily.  No blood sugars are in the 120 range.  No hypoglycemia.  He is having some mild GI side effects from the medication, but no abdominal pain, nor diarrhea.  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  Also, his son has recently been diagnosed with ADD.  Academically, he had a 3.3, in high school and went to [employer] on academic scholarship and had a 3.2 GPA.  He then went to dental school, but did not pass.  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  He states his main problem is focus and concentration in getting things done and getting organized.   Review of Systems    General metabolic and psychiatric review of systems otherwise negative Objective:   Physical Exam Well-developed well-nourished, overweight male in no acute distress       Assessment & Plan:  Diabetes type 2, in improved control continue metformin 500 b.i.d., nutrition consult for diet, exercise and weight loss, mid, follow-up in 3 weeks.  Trial of Adderall 10 mg b.i.d. Follow-up in 3 weeks

## 2011-10-14 NOTE — Patient Instructions (Signed)
Continue the metformin 500 mg twice daily, however, it if you would like to see if it would lessen your GI side effects.  Decrease the evening dose to 250 mg.  Start the Adderall 10 mg twice daily.  We will set to you and Fleet Contras up for a nutrition consult to talk about diet, etc.  Return in 3 weeks for follow-up.  Continue to check a fasting blood sugar daily in the morning

## 2011-11-05 ENCOUNTER — Encounter: Payer: Self-pay | Admitting: Family Medicine

## 2011-11-05 ENCOUNTER — Ambulatory Visit (INDEPENDENT_AMBULATORY_CARE_PROVIDER_SITE_OTHER): Payer: Federal, State, Local not specified - PPO | Admitting: Family Medicine

## 2011-11-05 DIAGNOSIS — E119 Type 2 diabetes mellitus without complications: Secondary | ICD-10-CM

## 2011-11-05 DIAGNOSIS — E785 Hyperlipidemia, unspecified: Secondary | ICD-10-CM

## 2011-11-05 DIAGNOSIS — I10 Essential (primary) hypertension: Secondary | ICD-10-CM

## 2011-11-05 NOTE — Patient Instructions (Signed)
Continue your current medications  Continue your diet and walk 20 minutes daily  Followup the fourth week in March,,,,,,,,,, nonfasting labs one week prior

## 2011-11-05 NOTE — Progress Notes (Signed)
  Subjective:    Patient ID: Leonard Rojas, male    DOB: 04-22-1966, 46 y.o.   MRN: [id number]  HPI [name] is a 46 year old married male nonsmoker who comes in today for followup of diabetes  In early December of 2012 his A1c had gone up to 11. He restarted his diet and exercise program. We maintained his metformin 500 mg twice a day now blood sugar is in the 90-120 range.  Blood pressure normal 120/84  When his blood sugar went up also his LDL cholesterol went up. He's currently taking Lipitor 10 mg daily. We'll followup on lipid panel since blood sugar is stable   Review of Systems General and metabolic review of systems otherwise negative    Objective:   Physical Exam Well-developed well-nourished male in no acute distress       Assessment & Plan:  Diabetes type 2 at goal continue current therapy followup in 6 weeks labs one week prior  Hypertension continue lisinopril 10 mg daily  Hyperlipidemia continue Lipitor 10 mg each bedtime and a baby aspirin will

## 2011-11-17 ENCOUNTER — Other Ambulatory Visit: Payer: Federal, State, Local not specified - PPO

## 2011-12-15 ENCOUNTER — Other Ambulatory Visit: Payer: Federal, State, Local not specified - PPO

## 2011-12-23 ENCOUNTER — Ambulatory Visit: Payer: Federal, State, Local not specified - PPO | Admitting: Family Medicine

## 2011-12-31 ENCOUNTER — Encounter: Payer: Self-pay | Admitting: *Deleted

## 2011-12-31 ENCOUNTER — Encounter: Payer: Federal, State, Local not specified - PPO | Attending: Family Medicine | Admitting: *Deleted

## 2011-12-31 VITALS — Ht 71.0 in | Wt 294.5 lb

## 2011-12-31 DIAGNOSIS — E119 Type 2 diabetes mellitus without complications: Secondary | ICD-10-CM | POA: Insufficient documentation

## 2011-12-31 DIAGNOSIS — Z713 Dietary counseling and surveillance: Secondary | ICD-10-CM | POA: Insufficient documentation

## 2011-12-31 NOTE — Patient Instructions (Addendum)
Goals:  Follow Diabetes Meal Plan as instructed (see yellow card).  Eat 3 meals and 2 snacks, every 3-5 hrs  Add lean protein foods to meals/snacks  Monitor glucose levels as instructed by your doctor  Bring glucose log to your next nutrition visit  Aim for 30-60 mins of physical activity daily  Limit regular soda, sweet tea, and fast food.

## 2011-12-31 NOTE — Progress Notes (Signed)
Medical Nutrition Therapy:  Appt start time: 1630   End time:  1730.  Assessment:  Primary concerns today: T2DM.  Pt here with recent A1c of 11% for assessment of T2DM. States he checks BGs only 8-9 times/month because he "doesn't want to know what his sugar is".  Reports most recent fasting BGs were 100-130 mg. Food recall reveals highly excessive CHO, fat, and sodium daily from fast food intake, including regular soda. Pt is familiar with CHO counting and states no need for help with this. States he has a weight goal of 215 lbs. No consistent physical activity or pain noted at this time.   MEDICATIONS: See medication list. Reconciled with pt.    DIETARY INTAKE:  Usual eating pattern includes 3 meals and 1-2 snacks per day.  24-hr recall:  B ( AM): Sausage biscuit, hash browns, sweet tea  Snk ( AM): None  L ( PM): Burger, fries, sweet tea or reg soda Snk ( PM): Fruit or pretzels or chips D ( PM): Chicken (baked or broiled), salad or broccoli or rice; Pepsi Max or Sprite Zero Snk ( PM): Fruit   Usual physical activity: None  Estimated energy needs: 1500-1700 calories 170-190 g carbohydrates 110-125 g protein 40-50 g fat  Progress Towards Goal(s):  In progress.   Nutritional Diagnosis:  [place]-2.1 Impaired nutrient utilization related to glucose metabolism as evidenced by recent A1c of 11% and excessive CHO intake.    Intervention:  Nutrition education.  Goals:  Follow Diabetes Meal Plan as instructed (see yellow card).  Eat 3 meals and 2 snacks, every 3-5 hrs  Add lean protein foods to meals/snacks  Monitor glucose levels as instructed by your doctor  Bring glucose log to your next nutrition visit  Aim for 30-60 mins of physical activity daily  Limit regular soda, sweet tea, and fast food.  Handouts given during visit include:  Living Well with Diabetes - [employer]  Low CHO snack list  30g, 60g CHO menus  Monitoring/Evaluation:  Dietary intake, exercise, A1c, BG  trends, and body weight in 6 week(s).

## 2012-01-01 ENCOUNTER — Other Ambulatory Visit: Payer: Self-pay | Admitting: Family Medicine

## 2012-01-01 ENCOUNTER — Encounter: Payer: Self-pay | Admitting: *Deleted

## 2012-01-01 DIAGNOSIS — F988 Other specified behavioral and emotional disorders with onset usually occurring in childhood and adolescence: Secondary | ICD-10-CM

## 2012-01-01 MED ORDER — AMPHETAMINE-DEXTROAMPHETAMINE 10 MG PO TABS: 10.0000 mg | ORAL_TABLET | Freq: Two times a day (BID) | ORAL | Status: DC

## 2012-01-01 NOTE — Telephone Encounter (Signed)
Rx ready for pick up.  Left message on machine for patient. 

## 2012-01-01 NOTE — Telephone Encounter (Signed)
Pt need new rx generic adderall 10 mg twice a day. Pt has bloodwork appt tomorrow afternoon. Pt would like to pick up rx tomorrow

## 2012-01-02 ENCOUNTER — Other Ambulatory Visit (INDEPENDENT_AMBULATORY_CARE_PROVIDER_SITE_OTHER): Payer: Federal, State, Local not specified - PPO

## 2012-01-02 DIAGNOSIS — E119 Type 2 diabetes mellitus without complications: Secondary | ICD-10-CM

## 2012-01-02 DIAGNOSIS — E785 Hyperlipidemia, unspecified: Secondary | ICD-10-CM

## 2012-01-02 NOTE — Progress Notes (Signed)
Addended by: Rita Ohara R on: 01/02/2012 04:09 PM   Modules accepted: Orders

## 2012-01-03 LAB — LIPID PANEL
LDL Cholesterol: 118 mg/dL — ABNORMAL HIGH (ref 0–99)
Triglycerides: 115 mg/dL (ref <150)
VLDL: 23 mg/dL (ref 0–40)

## 2012-01-03 LAB — BASIC METABOLIC PANEL
BUN: 18 mg/dL (ref 6–23)
CO2: 26 mEq/L (ref 19–32)
Calcium: 9.5 mg/dL (ref 8.4–10.5)
Creat: 0.88 mg/dL (ref 0.50–1.35)
Glucose, Bld: 117 mg/dL — ABNORMAL HIGH (ref 70–99)

## 2012-01-03 LAB — HEMOGLOBIN A1C: Hgb A1c MFr Bld: 8 % — ABNORMAL HIGH (ref <5.7)

## 2012-01-06 ENCOUNTER — Other Ambulatory Visit: Payer: Self-pay | Admitting: *Deleted

## 2012-01-06 NOTE — Telephone Encounter (Signed)
Left message on machine for patient to call and schedule an appointment

## 2012-01-27 ENCOUNTER — Ambulatory Visit: Payer: Federal, State, Local not specified - PPO | Admitting: Family Medicine

## 2012-01-28 ENCOUNTER — Ambulatory Visit: Payer: Federal, State, Local not specified - PPO | Admitting: Family Medicine

## 2012-02-03 ENCOUNTER — Encounter: Payer: Self-pay | Admitting: Family Medicine

## 2012-02-03 ENCOUNTER — Ambulatory Visit (INDEPENDENT_AMBULATORY_CARE_PROVIDER_SITE_OTHER): Payer: Federal, State, Local not specified - PPO | Admitting: Family Medicine

## 2012-02-03 VITALS — BP 120/80 | Temp 98.1°F | Wt 292.0 lb

## 2012-02-03 DIAGNOSIS — E119 Type 2 diabetes mellitus without complications: Secondary | ICD-10-CM

## 2012-02-03 DIAGNOSIS — E785 Hyperlipidemia, unspecified: Secondary | ICD-10-CM

## 2012-02-03 DIAGNOSIS — I10 Essential (primary) hypertension: Secondary | ICD-10-CM

## 2012-02-03 NOTE — Patient Instructions (Signed)
Continue your current treatment program  Check a fasting blood sugar Monday Wednesday Friday since your blood sugar is back to normal  Set up a followup appointment in 2 months  Nonfasting lab work 1 week prior

## 2012-02-03 NOTE — Progress Notes (Signed)
  Subjective:    Patient ID: Leonard Rojas, male    DOB: [birth year], 46 y.o.   MRN: [id number]  HPI Leonard Rojas is a 46 year old married male nonsmoker who comes in today for evaluation of diabetes hypertension and hyperlipidemia  He went to see a nutritionist and male his blood sugar has dropped from the 190 range down to 117. A1c 5 months ago was 11 mass down to 8.0. Lipids have dropped but not back to normal. He's taking Lipitor 10 mg daily. LDL is 118 goal less than 75. Blood pressure is 120/80  He has cut his metformin down to 500 mg once daily because 2 pills a day causes his blood sugar to be too low. He thinks the diet and exercise has been of great benefit   Review of Systems Gen. metabolic review of systems otherwise negative    Objective:   Physical Exam Well-developed well-nourished male in no acute distress       Assessment & Plan:  Diabetes type 2 improved control continue metformin 1 daily diet and exercise followup in 2 months  Hypertension at goal  Hyperlipidemia approaching goal continue Lipitor 10 mg daily

## 2012-02-11 ENCOUNTER — Encounter: Payer: Federal, State, Local not specified - PPO | Attending: Family Medicine | Admitting: *Deleted

## 2012-02-11 ENCOUNTER — Encounter: Payer: Self-pay | Admitting: *Deleted

## 2012-02-11 VITALS — Ht 71.0 in | Wt 286.8 lb

## 2012-02-11 DIAGNOSIS — Z713 Dietary counseling and surveillance: Secondary | ICD-10-CM | POA: Insufficient documentation

## 2012-02-11 DIAGNOSIS — E119 Type 2 diabetes mellitus without complications: Secondary | ICD-10-CM | POA: Insufficient documentation

## 2012-02-11 NOTE — Patient Instructions (Signed)
Goals:  Continue previous goals.   Follow up prn.

## 2012-02-11 NOTE — Progress Notes (Signed)
Medical Nutrition Therapy:  Appt start time: 1630   End time:  1700.  Primary concerns today: T2DM; F/U.  Pt here with spouse for f/u. Recent A1c (12/2011) of 8.0% noted in EPIC. Pt has decreased sodas, high fat foods, and sweets. Decreased portions of food as well.  Has lost 7.5 lbs since last visit (12/31/11). Doing very well and reports no problems.   MEDICATIONS: See medication list. Reconciled with pt.    Usual physical activity: Working in the yard; joined gym  Estimated energy needs: 1500-1700 calories 170-190 g carbohydrates 110-125 g protein 40-50 g fat  Progress Towards Goal(s):  Resolved.   Nutritional Diagnosis:  None    Intervention:  Nutrition education prn.  Goals:  Continue previous goals.   Follow up prn.  Monitoring/Evaluation:  Dietary intake, exercise, A1c, BG trends, and body weight prn.

## 2012-03-31 ENCOUNTER — Other Ambulatory Visit (INDEPENDENT_AMBULATORY_CARE_PROVIDER_SITE_OTHER): Payer: Federal, State, Local not specified - PPO

## 2012-03-31 DIAGNOSIS — E119 Type 2 diabetes mellitus without complications: Secondary | ICD-10-CM

## 2012-03-31 DIAGNOSIS — E785 Hyperlipidemia, unspecified: Secondary | ICD-10-CM

## 2012-03-31 LAB — LIPID PANEL
Cholesterol: 172 mg/dL (ref 0–200)
LDL Cholesterol: 104 mg/dL — ABNORMAL HIGH (ref 0–99)
Triglycerides: 87 mg/dL (ref 0.0–149.0)

## 2012-03-31 LAB — BASIC METABOLIC PANEL
BUN: 13 mg/dL (ref 6–23)
CO2: 26 mEq/L (ref 19–32)
Chloride: 102 mEq/L (ref 96–112)
GFR: 104.41 mL/min (ref >60.00)
Glucose, Bld: 118 mg/dL — ABNORMAL HIGH (ref 70–99)
Potassium: 3.7 mEq/L (ref 3.5–5.1)

## 2012-04-08 ENCOUNTER — Ambulatory Visit (INDEPENDENT_AMBULATORY_CARE_PROVIDER_SITE_OTHER): Payer: Federal, State, Local not specified - PPO | Admitting: Family Medicine

## 2012-04-08 ENCOUNTER — Encounter: Payer: Self-pay | Admitting: Family Medicine

## 2012-04-08 VITALS — BP 110/80 | Temp 98.3°F | Wt 296.0 lb

## 2012-04-08 DIAGNOSIS — E119 Type 2 diabetes mellitus without complications: Secondary | ICD-10-CM

## 2012-04-08 NOTE — Patient Instructions (Signed)
Continue your current medications  Begin a daily walking program  Return in December for your annual exam labs one week prior

## 2012-04-08 NOTE — Progress Notes (Signed)
  Subjective:    Patient ID: VERON SENNER, male    DOB: 1965-12-08, 46 y.o.   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 who comes in today for followup of diabetes type 2  He's currently on metformin 500 mg daily and his A1c is 6.7% fasting blood sugar 118  He's also Lipitor 10 mg daily lipids are at goal no hypoglycemia   Review of Systems General and metabolic review of systems otherwise negative except she's not exercising daily    Objective:   Physical Exam  Well-developed well-nourished male no acute distress weight still up to 96 BP 110/80      Assessment & Plan:  Diabetes type 2 at goal continue current therapy followup in December  Hyperlipidemia goal continue Lipitor followup in December  Overweight and inactive begin daily walking program

## 2012-09-15 ENCOUNTER — Other Ambulatory Visit (INDEPENDENT_AMBULATORY_CARE_PROVIDER_SITE_OTHER): Payer: Federal, State, Local not specified - PPO

## 2012-09-15 DIAGNOSIS — Z0000 Encounter for general adult medical examination without abnormal findings: Secondary | ICD-10-CM

## 2012-09-15 DIAGNOSIS — E119 Type 2 diabetes mellitus without complications: Secondary | ICD-10-CM

## 2012-09-15 LAB — CBC WITH DIFFERENTIAL/PLATELET
Basophils Absolute: 0 10*3/uL (ref 0.0–0.1)
Basophils Relative: 0.3 % (ref 0.0–3.0)
Eosinophils Absolute: 0.4 10*3/uL (ref 0.0–0.7)
Eosinophils Relative: 6.2 % — ABNORMAL HIGH (ref 0.0–5.0)
HCT: 41.3 % (ref 39.0–52.0)
Hemoglobin: 13.5 g/dL (ref 13.0–17.0)
Lymphocytes Relative: 25.3 % (ref 12.0–46.0)
Lymphs Abs: 1.7 10*3/uL (ref 0.7–4.0)
MCHC: 32.7 g/dL (ref 30.0–36.0)
MCV: 87.2 fl (ref 78.0–100.0)
Monocytes Absolute: 0.5 10*3/uL (ref 0.1–1.0)
Monocytes Relative: 7.5 % (ref 3.0–12.0)
Neutro Abs: 4 10*3/uL (ref 1.4–7.7)
Neutrophils Relative %: 60.7 % (ref 43.0–77.0)
Platelets: 241 10*3/uL (ref 150.0–400.0)
RBC: 4.74 Mil/uL (ref 4.22–5.81)
RDW: 14.4 % (ref 11.5–14.6)
WBC: 6.6 10*3/uL (ref 4.5–10.5)

## 2012-09-15 LAB — TSH: TSH: 4.43 u[IU]/mL (ref 0.35–5.50)

## 2012-09-15 LAB — BASIC METABOLIC PANEL
BUN: 16 mg/dL (ref 6–23)
Creatinine, Ser: 0.9 mg/dL (ref 0.4–1.5)
GFR: 111.99 mL/min (ref >60.00)
Glucose, Bld: 150 mg/dL — ABNORMAL HIGH (ref 70–99)
Potassium: 4.1 mEq/L (ref 3.5–5.1)

## 2012-09-15 LAB — HEPATIC FUNCTION PANEL
ALT: 22 U/L (ref 0–53)
AST: 19 U/L (ref 0–37)
Albumin: 3.6 g/dL (ref 3.5–5.2)
Alkaline Phosphatase: 74 U/L (ref 39–117)
Bilirubin, Direct: 0 mg/dL (ref 0.0–0.3)
Total Bilirubin: 0.5 mg/dL (ref 0.3–1.2)
Total Protein: 7.1 g/dL (ref 6.0–8.3)

## 2012-09-15 LAB — POCT URINALYSIS DIPSTICK
Bilirubin, UA: NEGATIVE
Blood, UA: NEGATIVE
Nitrite, UA: NEGATIVE
Protein, UA: NEGATIVE
pH, UA: 5

## 2012-09-15 LAB — LIPID PANEL
HDL: 42.9 mg/dL (ref >39.00)
Total CHOL/HDL Ratio: 5
Triglycerides: 81 mg/dL (ref 0.0–149.0)

## 2012-09-15 LAB — MICROALBUMIN / CREATININE URINE RATIO: Microalb, Ur: 0.4 mg/dL (ref 0.0–1.9)

## 2012-09-15 LAB — HEMOGLOBIN A1C: Hgb A1c MFr Bld: 8.5 % — ABNORMAL HIGH (ref 4.6–6.5)

## 2012-09-30 ENCOUNTER — Ambulatory Visit (INDEPENDENT_AMBULATORY_CARE_PROVIDER_SITE_OTHER): Payer: Federal, State, Local not specified - PPO | Admitting: Family Medicine

## 2012-09-30 ENCOUNTER — Encounter: Payer: Self-pay | Admitting: Family Medicine

## 2012-09-30 VITALS — BP 120/80 | Temp 98.1°F | Ht 71.75 in | Wt 301.0 lb

## 2012-09-30 DIAGNOSIS — Z23 Encounter for immunization: Secondary | ICD-10-CM

## 2012-09-30 DIAGNOSIS — F988 Other specified behavioral and emotional disorders with onset usually occurring in childhood and adolescence: Secondary | ICD-10-CM

## 2012-09-30 DIAGNOSIS — I10 Essential (primary) hypertension: Secondary | ICD-10-CM

## 2012-09-30 DIAGNOSIS — E119 Type 2 diabetes mellitus without complications: Secondary | ICD-10-CM

## 2012-09-30 DIAGNOSIS — E785 Hyperlipidemia, unspecified: Secondary | ICD-10-CM

## 2012-09-30 MED ORDER — LISINOPRIL 10 MG PO TABS: 10.0000 mg | ORAL_TABLET | Freq: Every day | ORAL | Status: DC

## 2012-09-30 MED ORDER — METFORMIN HCL 500 MG PO TABS: ORAL_TABLET | ORAL | Status: DC

## 2012-09-30 MED ORDER — ATORVASTATIN CALCIUM 10 MG PO TABS: 10.0000 mg | ORAL_TABLET | Freq: Every day | ORAL | Status: DC

## 2012-09-30 MED ORDER — AMPHETAMINE-DEXTROAMPHETAMINE 10 MG PO TABS: 10.0000 mg | ORAL_TABLET | Freq: Two times a day (BID) | ORAL | Status: DC

## 2012-09-30 NOTE — Progress Notes (Signed)
  Subjective:    Patient ID: Leonard Rojas, male    DOB: 1966/01/31, 47 y.o.   MRN: [id number]  HPI Leonard Rojas is a 47 year old married male nonsmoker who comes in today for annual physical examination because of a history of mild adult ADD, hyperlipidemia, hypertension, and diabetes type 2  Last year we decreased his metformin from 500 twice a day to 500 mg daily now his blood sugar has gone up in the 150 range his A1c is now 8.5%. He states she's not been following his diet his weight is up to 301 pounds.  He gets routine eye care, dental care, tetanus booster 2004, seasonal flu shot today   Review of Systems  Constitutional: Negative.   HENT: Negative.   Eyes: Negative.   Respiratory: Negative.   Cardiovascular: Negative.   Gastrointestinal: Negative.   Genitourinary: Negative.   Musculoskeletal: Negative.   Skin: Negative.   Neurological: Negative.   Hematological: Negative.   Psychiatric/Behavioral: Negative.        Objective:   Physical Exam  Constitutional: He is oriented to person, place, and time. He appears well-developed and well-nourished.  HENT:  Head: Normocephalic and atraumatic.  Right Ear: External ear normal.  Left Ear: External ear normal.  Nose: Nose normal.  Mouth/Throat: Oropharynx is clear and moist.  Eyes: Conjunctivae normal and EOM are normal. Pupils are equal, round, and reactive to light.  Neck: Normal range of motion. Neck supple. No JVD present. No tracheal deviation present. No thyromegaly present.  Cardiovascular: Normal rate, regular rhythm, normal heart sounds and intact distal pulses.  Exam reveals no gallop and no friction rub.   No murmur heard. Pulmonary/Chest: Effort normal and breath sounds normal. No stridor. No respiratory distress. He has no wheezes. He has no rales. He exhibits no tenderness.  Abdominal: Soft. Bowel sounds are normal. He exhibits no distension and no mass. There is no tenderness. There is no rebound and no guarding.    Genitourinary: Rectum normal, prostate normal and penis normal. Guaiac negative stool. No penile tenderness.  Musculoskeletal: Normal range of motion. He exhibits no edema and no tenderness.  Lymphadenopathy:    He has no cervical adenopathy.  Neurological: He is alert and oriented to person, place, and time. He has normal reflexes. He displays normal reflexes. No cranial nerve deficit. He exhibits normal muscle tone. Coordination normal.       No neuropathy  Skin: Skin is warm and dry. No rash noted. No erythema. No pallor.  Psychiatric: He has a normal mood and affect. His behavior is normal. Judgment and thought content normal.          Assessment & Plan:  Healthy male  Diabetes type 2 not at goal plan increase metformin as outlined followup in 3 months  Hyperlipidemia continue Lipitor 10 mg daily and an aspirin tablet  Hypertension continue lisinopril 10 mg daily  Adult ADD Adderall 10 mg when necessary  Obesity discussed diet exercise and weight loss followup in 3 months

## 2012-09-30 NOTE — Patient Instructions (Signed)
Continue your current medications except increase the metformin to one twice daily  Resume your diet and exercise program  Followup in 3 months nonfasting labs one week prior

## 2012-11-25 ENCOUNTER — Other Ambulatory Visit: Payer: Self-pay | Admitting: Family Medicine

## 2012-12-29 ENCOUNTER — Other Ambulatory Visit: Payer: Federal, State, Local not specified - PPO

## 2013-01-06 ENCOUNTER — Ambulatory Visit: Payer: Federal, State, Local not specified - PPO | Admitting: Family Medicine

## 2013-03-15 ENCOUNTER — Other Ambulatory Visit: Payer: Federal, State, Local not specified - PPO

## 2013-03-15 LAB — BASIC METABOLIC PANEL
GFR: 107.74 mL/min (ref >60.00)
Potassium: 3.8 mEq/L (ref 3.5–5.1)
Sodium: 141 mEq/L (ref 135–145)

## 2013-03-15 NOTE — Addendum Note (Signed)
Addended by: Rita Ohara R on: 03/15/2013 03:51 PM   Modules accepted: Orders

## 2013-03-22 ENCOUNTER — Encounter: Payer: Self-pay | Admitting: Family Medicine

## 2013-03-22 ENCOUNTER — Ambulatory Visit (INDEPENDENT_AMBULATORY_CARE_PROVIDER_SITE_OTHER): Payer: Federal, State, Local not specified - PPO | Admitting: Family Medicine

## 2013-03-22 VITALS — BP 110/80 | Temp 98.4°F | Wt 298.0 lb

## 2013-03-22 DIAGNOSIS — E785 Hyperlipidemia, unspecified: Secondary | ICD-10-CM

## 2013-03-22 DIAGNOSIS — F988 Other specified behavioral and emotional disorders with onset usually occurring in childhood and adolescence: Secondary | ICD-10-CM

## 2013-03-22 DIAGNOSIS — I10 Essential (primary) hypertension: Secondary | ICD-10-CM

## 2013-03-22 DIAGNOSIS — E119 Type 2 diabetes mellitus without complications: Secondary | ICD-10-CM

## 2013-03-22 MED ORDER — AMPHETAMINE-DEXTROAMPHETAMINE 10 MG PO TABS: 10.0000 mg | ORAL_TABLET | Freq: Two times a day (BID) | ORAL | Status: DC

## 2013-03-22 NOTE — Progress Notes (Signed)
  Subjective:    Patient ID: Leonard Rojas, male    DOB: [birth year], 47 y.o.   MRN: [id number]  HPI Leonard Rojas is a 47 year old male married nonsmoker who comes in today for followup of diabetes hypertension adult ADD  His blood sugar on metformin 500 mg twice a day is 92 with an A1c of 7.4. He's done very well over the past year a year ago his A1c was 11. He still struggles with his weight is 298 pounds BP normal 110/80 on lisinopril 10 mg daily  He's also started school again and he has adult ADD and would like a refill on his Adderall.  Physical exam January   Review of Systems    review of systems negative except she's having difficulty remembering to take his evening dose of metformin advised him to take it then at bedtime Objective:   Physical Exam Well-developed well nourished male no acute distress weight 298 BP 110/80       Assessment & Plan:  Diabetes type 2 at goal almost,,,,,,, increased exercise and diet take medication twice daily followup in 3 months  Blood pressure goal continue current therapy  Overweight again encouraged diet exercise and weight loss  Adult ADD restart Adderall 10 mg twice a day

## 2013-03-22 NOTE — Patient Instructions (Signed)
Continue the metformin twice daily,,,,,,,,,,, since you have difficulty remembering to take that dose before your evening meal just take it at bedtime  Check a fasting blood sugar weekly  Walk 30 minutes daily  Return in 3 months for followup sooner if any problems  Nonfasting labs one week prior to your next appointment

## 2013-06-14 ENCOUNTER — Other Ambulatory Visit: Payer: Federal, State, Local not specified - PPO

## 2013-06-21 ENCOUNTER — Ambulatory Visit: Payer: Federal, State, Local not specified - PPO | Admitting: Family Medicine

## 2013-07-11 ENCOUNTER — Other Ambulatory Visit (INDEPENDENT_AMBULATORY_CARE_PROVIDER_SITE_OTHER): Payer: Federal, State, Local not specified - PPO

## 2013-07-11 DIAGNOSIS — E119 Type 2 diabetes mellitus without complications: Secondary | ICD-10-CM

## 2013-07-11 LAB — BASIC METABOLIC PANEL
BUN: 10 mg/dL (ref 6–23)
CO2: 29 mEq/L (ref 19–32)
Chloride: 102 mEq/L (ref 96–112)
Creatinine, Ser: 1 mg/dL (ref 0.4–1.5)
Potassium: 3.9 mEq/L (ref 3.5–5.1)

## 2013-07-18 ENCOUNTER — Ambulatory Visit (INDEPENDENT_AMBULATORY_CARE_PROVIDER_SITE_OTHER): Payer: Federal, State, Local not specified - PPO | Admitting: Family Medicine

## 2013-07-18 ENCOUNTER — Encounter: Payer: Self-pay | Admitting: Family Medicine

## 2013-07-18 VITALS — BP 110/80 | Temp 98.2°F | Wt 300.0 lb

## 2013-07-18 DIAGNOSIS — E119 Type 2 diabetes mellitus without complications: Secondary | ICD-10-CM

## 2013-07-18 MED ORDER — METFORMIN HCL 1000 MG PO TABS: ORAL_TABLET | ORAL | Status: DC

## 2013-07-18 NOTE — Patient Instructions (Signed)
Increase metformin to 1000 mg before breakfast and 1000 mg prior to your evening meal  Strict diet  Drink lots of water  Walk 30 minutes daily  Fasting blood sugar daily in the morning  Followup here in 4 weeks with a record of all your blood sugar readings

## 2013-07-18 NOTE — Progress Notes (Signed)
  Subjective:    Patient ID: Leonard Rojas, male    DOB: 08-13-1966, 47 y.o.   MRN: [id number]  HPI Leonard Rojas is a 47 year old male who comes in today for followup of diabetes. His A1c has gone from 7.4% up to 9.6%. His blood sugar now is in the 190 range. He's not really following a strict diet nor is he exercising daily.   Review of Systems    review of systems otherwise negative Objective:   Physical Exam  Well-developed well-nourished male no acute distress weight up to 300 pounds BP 110/80      Assessment & Plan:  Diabetes type 2 plan strict diet walk 30 minutes daily and doubled the metformin 1000 mg twice a day followup in one month

## 2013-08-10 ENCOUNTER — Ambulatory Visit (INDEPENDENT_AMBULATORY_CARE_PROVIDER_SITE_OTHER): Payer: Federal, State, Local not specified - PPO | Admitting: Family Medicine

## 2013-08-10 ENCOUNTER — Encounter: Payer: Self-pay | Admitting: Family Medicine

## 2013-08-10 VITALS — BP 120/90 | Temp 98.1°F | Wt 295.0 lb

## 2013-08-10 DIAGNOSIS — E119 Type 2 diabetes mellitus without complications: Secondary | ICD-10-CM

## 2013-08-10 DIAGNOSIS — E785 Hyperlipidemia, unspecified: Secondary | ICD-10-CM

## 2013-08-10 DIAGNOSIS — I10 Essential (primary) hypertension: Secondary | ICD-10-CM

## 2013-08-10 NOTE — Progress Notes (Signed)
Pre visit review using our clinic review tool, if applicable. No additional management support is needed unless otherwise documented below in the visit note. 

## 2013-08-10 NOTE — Patient Instructions (Signed)
Continue your diet and exercise program  Set up a physical exam time for 3 months  Labs one week prior......... fasting

## 2013-08-10 NOTE — Progress Notes (Signed)
  Subjective:    Patient ID: Leonard Rojas, male    DOB: [birth year], 47 y.o.   MRN: [id number]  HPI Leonard Rojas is a 47 year old male who comes in today for followup of diabetes and hypertension  She got very aggressive with his diet and exercise he's walking 3 miles a day. His blood sugar dropped in normal. He's been off his metformin for couple weeks. Fasting blood sugars now average 90 off medication  BP normal on lisinopril 10 mg daily  Review of Systems     Objective:   Physical Exam  Well-developed well-nourished male no acute distress weight down to 295 BP 120/90      Assessment & Plan:  Diabetes type 2 improved control with diet and exercise off medicine continue the above followup in the spring for CPX

## 2013-11-03 ENCOUNTER — Other Ambulatory Visit (INDEPENDENT_AMBULATORY_CARE_PROVIDER_SITE_OTHER): Payer: Federal, State, Local not specified - PPO

## 2013-11-03 DIAGNOSIS — I10 Essential (primary) hypertension: Secondary | ICD-10-CM

## 2013-11-03 DIAGNOSIS — E785 Hyperlipidemia, unspecified: Secondary | ICD-10-CM

## 2013-11-03 DIAGNOSIS — E119 Type 2 diabetes mellitus without complications: Secondary | ICD-10-CM

## 2013-11-03 LAB — HEPATIC FUNCTION PANEL
ALK PHOS: 56 U/L (ref 39–117)
ALT: 15 U/L (ref 0–53)
AST: 19 U/L (ref 0–37)
Albumin: 3.7 g/dL (ref 3.5–5.2)
BILIRUBIN DIRECT: 0 mg/dL (ref 0.0–0.3)
TOTAL PROTEIN: 6.7 g/dL (ref 6.0–8.3)
Total Bilirubin: 0.5 mg/dL (ref 0.3–1.2)

## 2013-11-03 LAB — CBC WITH DIFFERENTIAL/PLATELET
BASOS PCT: 0.4 % (ref 0.0–3.0)
Basophils Absolute: 0 10*3/uL (ref 0.0–0.1)
EOS ABS: 0.4 10*3/uL (ref 0.0–0.7)
Eosinophils Relative: 6.4 % — ABNORMAL HIGH (ref 0.0–5.0)
HEMATOCRIT: 42.1 % (ref 39.0–52.0)
Hemoglobin: 13.4 g/dL (ref 13.0–17.0)
LYMPHS ABS: 1.3 10*3/uL (ref 0.7–4.0)
Lymphocytes Relative: 20.6 % (ref 12.0–46.0)
MCHC: 32 g/dL (ref 30.0–36.0)
MCV: 89.9 fl (ref 78.0–100.0)
MONO ABS: 0.4 10*3/uL (ref 0.1–1.0)
Monocytes Relative: 6.4 % (ref 3.0–12.0)
NEUTROS ABS: 4.3 10*3/uL (ref 1.4–7.7)
Neutrophils Relative %: 66.2 % (ref 43.0–77.0)
Platelets: 263 10*3/uL (ref 150.0–400.0)
RBC: 4.68 Mil/uL (ref 4.22–5.81)
RDW: 15 % — ABNORMAL HIGH (ref 11.5–14.6)
WBC: 6.5 10*3/uL (ref 4.5–10.5)

## 2013-11-03 LAB — BASIC METABOLIC PANEL
BUN: 22 mg/dL (ref 6–23)
CHLORIDE: 105 meq/L (ref 96–112)
CO2: 26 mEq/L (ref 19–32)
Calcium: 9.1 mg/dL (ref 8.4–10.5)
Creatinine, Ser: 1.1 mg/dL (ref 0.4–1.5)
GFR: 89.93 mL/min (ref >60.00)
Glucose, Bld: 84 mg/dL (ref 70–99)
POTASSIUM: 4.4 meq/L (ref 3.5–5.1)
Sodium: 138 mEq/L (ref 135–145)

## 2013-11-03 LAB — POCT URINALYSIS DIPSTICK
Bilirubin, UA: NEGATIVE
Blood, UA: NEGATIVE
Glucose, UA: NEGATIVE
KETONES UA: NEGATIVE
Leukocytes, UA: NEGATIVE
Nitrite, UA: NEGATIVE
PH UA: 5.5
Protein, UA: NEGATIVE
SPEC GRAV UA: 1.02
Urobilinogen, UA: 0.2

## 2013-11-03 LAB — LIPID PANEL
Cholesterol: 201 mg/dL — ABNORMAL HIGH (ref 0–200)
HDL: 42.2 mg/dL (ref >39.00)
Total CHOL/HDL Ratio: 5
Triglycerides: 49 mg/dL (ref 0.0–149.0)
VLDL: 9.8 mg/dL (ref 0.0–40.0)

## 2013-11-03 LAB — PSA: PSA: 0.7 ng/mL (ref 0.10–4.00)

## 2013-11-03 LAB — LDL CHOLESTEROL, DIRECT: LDL DIRECT: 150 mg/dL

## 2013-11-03 LAB — TSH: TSH: 2.28 u[IU]/mL (ref 0.35–5.50)

## 2013-11-03 LAB — MICROALBUMIN / CREATININE URINE RATIO
CREATININE, U: 149.8 mg/dL
Microalb Creat Ratio: 0.3 mg/g (ref 0.0–30.0)
Microalb, Ur: 0.5 mg/dL (ref 0.0–1.9)

## 2013-11-03 LAB — HEMOGLOBIN A1C: Hgb A1c MFr Bld: 6.3 % (ref 4.6–6.5)

## 2013-11-10 ENCOUNTER — Encounter: Payer: Self-pay | Admitting: Family Medicine

## 2013-11-10 ENCOUNTER — Ambulatory Visit (INDEPENDENT_AMBULATORY_CARE_PROVIDER_SITE_OTHER): Payer: Federal, State, Local not specified - PPO | Admitting: Family Medicine

## 2013-11-10 VITALS — BP 120/80 | Temp 98.5°F | Ht 71.0 in | Wt 279.0 lb

## 2013-11-10 DIAGNOSIS — E785 Hyperlipidemia, unspecified: Secondary | ICD-10-CM

## 2013-11-10 DIAGNOSIS — G4710 Hypersomnia, unspecified: Secondary | ICD-10-CM

## 2013-11-10 DIAGNOSIS — I10 Essential (primary) hypertension: Secondary | ICD-10-CM

## 2013-11-10 DIAGNOSIS — E119 Type 2 diabetes mellitus without complications: Secondary | ICD-10-CM

## 2013-11-10 DIAGNOSIS — F988 Other specified behavioral and emotional disorders with onset usually occurring in childhood and adolescence: Secondary | ICD-10-CM

## 2013-11-10 DIAGNOSIS — G4730 Sleep apnea, unspecified: Secondary | ICD-10-CM

## 2013-11-10 MED ORDER — AMPHETAMINE-DEXTROAMPHETAMINE 10 MG PO TABS
10.0000 mg | ORAL_TABLET | Freq: Two times a day (BID) | ORAL | Status: DC
Start: 2013-11-10 — End: 2013-12-07

## 2013-11-10 MED ORDER — LISINOPRIL 5 MG PO TABS: 5.0000 mg | ORAL_TABLET | Freq: Every day | ORAL | Status: DC

## 2013-11-10 MED ORDER — ATORVASTATIN CALCIUM 10 MG PO TABS: 10.0000 mg | ORAL_TABLET | Freq: Every day | ORAL | Status: DC

## 2013-11-10 MED ORDER — AMPHETAMINE-DEXTROAMPHETAMINE 10 MG PO TABS: 10.0000 mg | ORAL_TABLET | Freq: Two times a day (BID) | ORAL | Status: DC

## 2013-11-10 NOTE — Progress Notes (Signed)
Subjective:    Patient ID: Leonard Rojas, male    DOB: 1965/12/27, 48 y.o.   MRN: [id number]  HPI Leonard Rojas is a 48 year old male married nonsmoker who comes in today for his annual physical examination with a history of adult ADD, diabetes type 2, hyperlipidemia, obesity  He stopped taking his metformin and got on a strict diet and exercise program. His weight is down to 279 pounds and his blood sugar and A1c are normal. He takes Adderall 10 mg twice a day for adult ADD  He stopped his Lipitor however his LDL cholesterol is 150 therefore he needs to restart it along with an aspirin tablet. He also takes 10 mg of lisinopril daily for renal protection.  I congratulated him on getting his weight down exercising on a regular basis which is normalized his blood sugar and A1c without medication. However in the future he will need medication therefore continue to monitor blood sugar at home followup in 6 months  He also is complaining fatigue. His wife says he snores a lot and he stops breathing at night.   Review of Systems  Constitutional: Negative.   HENT: Negative.   Eyes: Negative.   Respiratory: Negative.   Cardiovascular: Negative.   Gastrointestinal: Negative.   Endocrine: Negative.   Genitourinary: Negative.   Musculoskeletal: Negative.   Skin: Negative.   Allergic/Immunologic: Negative.   Neurological: Negative.   Hematological: Negative.   Psychiatric/Behavioral: Negative.        Objective:   Physical Exam  Nursing note and vitals reviewed. Constitutional: He is oriented to person, place, and time. He appears well-developed and well-nourished.  HENT:  Head: Normocephalic and atraumatic.  Right Ear: External ear normal.  Left Ear: External ear normal.  Nose: Nose normal.  Mouth/Throat: Oropharynx is clear and moist.  Eyes: Conjunctivae and EOM are normal. Pupils are equal, round, and reactive to light.  Neck: Normal range of motion. Neck supple. No JVD present. No  tracheal deviation present. No thyromegaly present.  Cardiovascular: Normal rate, regular rhythm, normal heart sounds and intact distal pulses.  Exam reveals no gallop and no friction rub.   No murmur heard. No carotid or bruits peripheral pulses 2+ and symmetrical  Pulmonary/Chest: Effort normal and breath sounds normal. No stridor. No respiratory distress. He has no wheezes. He has no rales. He exhibits no tenderness.  Abdominal: Soft. Bowel sounds are normal. He exhibits no distension and no mass. There is no tenderness. There is no rebound and no guarding.  Genitourinary: Rectum normal, prostate normal and penis normal. Guaiac negative stool. No penile tenderness.  Musculoskeletal: Normal range of motion. He exhibits no edema and no tenderness.  Lymphadenopathy:    He has no cervical adenopathy.  Neurological: He is alert and oriented to person, place, and time. He has normal reflexes. No cranial nerve deficit. He exhibits normal muscle tone.  Skin: Skin is warm and dry. No rash noted. No erythema. No pallor.  Total body skin exam normal except for fungal infection both great toenails  Psychiatric: He has a normal mood and affect. His behavior is normal. Judgment and thought content normal.          Assessment & Plan:  Healthy male  Diabetes type 2,,,,,,,,, controlled with diet exercise and weight loss  History of hyperlipidemia restart Lipitor and aspirin  History of diabetes restart lisinopril for renal protection  History of adult ADD continue Adderall 10 mg twice a day  Symptoms consistent with sleep apnea consult  in pulmonary

## 2013-11-10 NOTE — Patient Instructions (Signed)
Continue diet and exercise program  Check a fasting blood sugar once weekly  Followup in 6 months sooner if any problems  We will get you set up a consult with Dr.clance in pulmonary for evaluation of possible sleep apnea

## 2013-11-10 NOTE — Progress Notes (Signed)
Pre visit review using our clinic review tool, if applicable. No additional management support is needed unless otherwise documented below in the visit note. 

## 2013-11-11 ENCOUNTER — Telehealth: Payer: Self-pay

## 2013-11-11 ENCOUNTER — Telehealth: Payer: Self-pay | Admitting: Family Medicine

## 2013-11-11 NOTE — Telephone Encounter (Signed)
Relevant patient education assigned to patient using Emmi. ° °

## 2013-12-07 ENCOUNTER — Encounter: Payer: Self-pay | Admitting: Pulmonary Disease

## 2013-12-07 ENCOUNTER — Ambulatory Visit (INDEPENDENT_AMBULATORY_CARE_PROVIDER_SITE_OTHER): Payer: Federal, State, Local not specified - PPO | Admitting: Pulmonary Disease

## 2013-12-07 VITALS — BP 122/88 | HR 71 | Temp 98.0°F | Ht 71.0 in | Wt 278.0 lb

## 2013-12-07 DIAGNOSIS — G4733 Obstructive sleep apnea (adult) (pediatric): Secondary | ICD-10-CM

## 2013-12-07 NOTE — Patient Instructions (Signed)
Will schedule for home sleep testing, and will arrange followup once the results are available.  Work on weight loss  

## 2013-12-07 NOTE — Assessment & Plan Note (Signed)
The patient's history is definitely suggestive of clinically significant sleep apnea. He has loud snoring with nonrestorative sleep, as well as sleep pressure during the day and evening. He is trying to go to school, and has difficulties maintaining concentration for his schoolwork and assignments without taking stimulant medication. I have had a long discussion with him about sleep apnea, including its impact to his cardiovascular health and quality of life. I have outlined 2 options, one of which is to work aggressively on a trial of weight loss over the next 6 months. The other, is to proceed with home sleep testing to make a diagnosis. The patient is concerned about his ability to lose weight feeling the way he does currently. I have therefore recommended that he proceed with sleep testing to put the issue to rest.

## 2013-12-07 NOTE — Progress Notes (Signed)
Subjective:    Patient ID: Leonard Rojas, male    DOB: [birth year], 48 y.o.   MRN: [id number]  HPI The patient is a 48 year old male who I've been asked to see for possible obstructive sleep apnea. He is been noted to have loud snoring during sleep, but is unsure if he has had issues with an abnormal breathing pattern. He does awaken at night, and is not rested in the mornings upon arising. He can definitely have sleep pressure during the afternoons with inactivity, and can doze in the evening watching television, especially if it is not entertaining. He also has definite sleep pressure driving longer distances. The patient is also attending school, but often has difficulties staying awake for his assignments unless he takes a stimulant medication. He tells me that his weight is down about 15 pounds from 2 years ago, and his Epworth score today is 9.   Sleep Questionnaire What time do you typically go to bed?( Between what hours) 11-12 11-12 at 1546 on 12/07/13 by Virl Cagey, CMA How long does it take you to fall asleep? 5-49mins 5-74mins at 1546 on 12/07/13 by Virl Cagey, CMA How many times during the night do you wake up? 1 1 at 1546 on 12/07/13 by Virl Cagey, CMA What time do you get out of bed to start your day? 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 (In pounds) 20 lb (9.072 kg) 20 lb (9.072 kg) at 1546 on 12/07/13 by Virl Cagey, CMA Have you ever had a sleep study before? No No at 1546 on 12/07/13 by Virl Cagey, CMA Do you currently use CPAP? No No at 1546 on 12/07/13 by Virl Cagey, CMA Do you wear oxygen at any time? No No at 1546 on 12/07/13 by Virl Cagey, CMA   Review of Systems   Constitutional: Negative for fever and unexpected weight change.  HENT: Positive for sneezing. Negative for congestion, dental problem, ear pain, nosebleeds, postnasal drip, rhinorrhea, sinus pressure, sore throat and trouble swallowing.   Eyes: Negative for redness and itching.  Respiratory: Negative for cough, chest tightness, shortness of breath and wheezing.   Cardiovascular: Negative for palpitations and leg swelling.  Gastrointestinal: Negative for nausea and vomiting.  Genitourinary: Negative for dysuria.  Musculoskeletal: Positive for arthralgias and joint swelling.  Skin: Negative for rash.  Neurological: Negative for headaches.  Hematological: Does not bruise/bleed easily.  Psychiatric/Behavioral: Negative for dysphoric mood. The patient is not nervous/anxious.        Objective:   Physical Exam Constitutional:  Obese male, no acute distress  HENT:  Nares patent without discharge  Oropharynx without exudate, palate and uvula are thickened and elongated.   Eyes:  Perrla, eomi, no scleral icterus  Neck:  No JVD, no TMG  Cardiovascular:  Normal rate, regular rhythm, no rubs or gallops.  No murmurs        Intact distal pulses  Pulmonary :  Normal breath sounds, no stridor or respiratory distress   No rales, rhonchi, or wheezing  Abdominal:  Soft, nondistended, bowel sounds present.  No tenderness noted.   Musculoskeletal:  No lower extremity edema noted.  Lymph Nodes:  No cervical lymphadenopathy noted  Skin:  No cyanosis noted  Neurologic:  Alert, appropriate, moves all 4 extremities without obvious deficit.         Assessment & Plan:

## 2013-12-16 DIAGNOSIS — G4733 Obstructive sleep apnea (adult) (pediatric): Secondary | ICD-10-CM

## 2013-12-21 ENCOUNTER — Telehealth: Payer: Self-pay | Admitting: Pulmonary Disease

## 2013-12-21 DIAGNOSIS — G4733 Obstructive sleep apnea (adult) (pediatric): Secondary | ICD-10-CM

## 2013-12-21 NOTE — Telephone Encounter (Signed)
Pt needs ov to review results of sleep study.

## 2013-12-22 ENCOUNTER — Encounter: Payer: Self-pay | Admitting: Pulmonary Disease

## 2013-12-23 NOTE — Telephone Encounter (Signed)
Pt returned call.  Set app tw/ [place] on 12/28/13 @ 4:30 PM (ok per Ashtyn).  States nothing further needed at this time.  Satira Anis

## 2013-12-23 NOTE — Telephone Encounter (Signed)
LMOM x 1 

## 2013-12-28 ENCOUNTER — Encounter: Payer: Self-pay | Admitting: Pulmonary Disease

## 2013-12-28 ENCOUNTER — Ambulatory Visit (INDEPENDENT_AMBULATORY_CARE_PROVIDER_SITE_OTHER): Payer: Federal, State, Local not specified - PPO | Admitting: Pulmonary Disease

## 2013-12-28 VITALS — BP 124/86 | HR 70 | Temp 98.1°F | Ht 71.0 in | Wt 279.0 lb

## 2013-12-28 DIAGNOSIS — G4733 Obstructive sleep apnea (adult) (pediatric): Secondary | ICD-10-CM

## 2013-12-28 NOTE — Assessment & Plan Note (Signed)
The patient has mild obstructive sleep apnea by his study, but he has significant sleep disruption and daytime symptoms. I have reviewed conservative path with a trial of weight loss alone, as well as more aggressive path with either dental appliance or CPAP. After a long discussion, the patient would like to try CPAP while working on weight loss. I will set the patient up on cpap at a moderate pressure level to allow for desensitization, and will troubleshoot the device over the next 4-6weeks if needed.  The pt is to call me if having issues with tolerance.  Will then optimize the pressure once patient is able to wear cpap on a consistent basis.

## 2013-12-28 NOTE — Patient Instructions (Signed)
Will start on cpap at a moderate pressure level.  Please call if you are having issues with tolerance. Work on weight loss followup with me again in 8 weeks.  

## 2013-12-28 NOTE — Progress Notes (Signed)
   Subjective:    Patient ID: Leonard Rojas, male    DOB: [birth year], 48 y.o.   MRN: [id number]  HPI Patient comes in today for followup of his recent home sleep test. He was found to have mild OSA, with an AHI of 11 events per hour and transient desaturation as low as 77%. I have reviewed the study with him in detail, and answered all of his questions.   Review of Systems  Constitutional: Negative for fever and unexpected weight change.  HENT: Negative for congestion, dental problem, ear pain, nosebleeds, postnasal drip, rhinorrhea, sinus pressure, sneezing, sore throat and trouble swallowing.   Eyes: Negative for redness and itching.  Respiratory: Negative for cough, chest tightness, shortness of breath and wheezing.   Cardiovascular: Negative for palpitations and leg swelling.  Gastrointestinal: Negative for nausea and vomiting.  Genitourinary: Negative for dysuria.  Musculoskeletal: Negative for joint swelling.  Skin: Negative for rash.  Neurological: Negative for headaches.  Hematological: Does not bruise/bleed easily.  Psychiatric/Behavioral: Negative for dysphoric mood. The patient is not nervous/anxious.        Objective:   Physical Exam Obese male in no acute distress Nose without purulence or discharge noted Neck without lymphadenopathy or thyromegaly Lower extremities without significant edema, no cyanosis Overweight, but mildly sleepy, moves all 4 extremities.       Assessment & Plan:

## 2014-02-22 ENCOUNTER — Encounter: Payer: Self-pay | Admitting: Pulmonary Disease

## 2014-02-22 ENCOUNTER — Ambulatory Visit (INDEPENDENT_AMBULATORY_CARE_PROVIDER_SITE_OTHER): Payer: Federal, State, Local not specified - PPO | Admitting: Pulmonary Disease

## 2014-02-22 VITALS — BP 108/76 | HR 60 | Ht 71.0 in | Wt 282.0 lb

## 2014-02-22 DIAGNOSIS — G4733 Obstructive sleep apnea (adult) (pediatric): Secondary | ICD-10-CM

## 2014-02-22 NOTE — Patient Instructions (Signed)
Stay on cpap, but let me know if you decide not to continue. Work on weight loss followup with me again in 13mos.

## 2014-02-22 NOTE — Progress Notes (Signed)
   Subjective:    Patient ID: Leonard Rojas, male    DOB: September 02, 1966, 48 y.o.   MRN: [id number]  HPI Patient comes in today for followup of his obstructive sleep apnea. He has been wearing CPAP compliantly by his download, with no significant mask leak and excellent control of his AHI. Despite this, the patient does not see a significant change in his sleep or his daytime alertness. However, his wife has noticed a big difference in his snoring, and is able to sleep more soundly.   Review of Systems  Constitutional: Negative for fever and unexpected weight change.  HENT: Negative for congestion, dental problem, ear pain, nosebleeds, postnasal drip, rhinorrhea, sinus pressure, sneezing, sore throat and trouble swallowing.   Eyes: Negative for redness and itching.  Respiratory: Negative for cough, chest tightness, shortness of breath and wheezing.   Cardiovascular: Negative for palpitations and leg swelling.  Gastrointestinal: Negative for nausea and vomiting.  Genitourinary: Negative for dysuria.  Musculoskeletal: Negative for joint swelling.  Skin: Negative for rash.  Neurological: Negative for headaches.  Hematological: Does not bruise/bleed easily.  Psychiatric/Behavioral: Negative for dysphoric mood. The patient is not nervous/anxious.        Objective:   Physical Exam Obese male in no acute distress Nose without purulence or discharge noted No skin breakdown or pressure necrosis from the CPAP mask Neck without lymphadenopathy or thyromegaly Lower extremities without edema, no cyanosis Alert and oriented, moves all 4 extremities.       Assessment & Plan:

## 2014-02-22 NOTE — Assessment & Plan Note (Signed)
The pt is wearing cpap compliantly, and has good control of his events.  Despite this, he does not feel this has made a difference to his sleep or how he feels during the day.  It has made a big difference to his wife's sleep, and she has not heard breakthrough snoring. At this point, he needs to make a decision whether to continue with CPAP in order to help his wife's sleep, versus discontinuing the device and just working on aggressive weight loss. Could also consider a dental appliance for snoring. At this point, the patient has decided to stay on CPAP while he is working on aggressive weight loss.

## 2014-05-02 ENCOUNTER — Other Ambulatory Visit (INDEPENDENT_AMBULATORY_CARE_PROVIDER_SITE_OTHER): Payer: Federal, State, Local not specified - PPO

## 2014-05-02 DIAGNOSIS — E785 Hyperlipidemia, unspecified: Secondary | ICD-10-CM

## 2014-05-02 DIAGNOSIS — E119 Type 2 diabetes mellitus without complications: Secondary | ICD-10-CM

## 2014-05-02 LAB — BASIC METABOLIC PANEL
BUN: 16 mg/dL (ref 6–23)
CHLORIDE: 103 meq/L (ref 96–112)
CO2: 27 meq/L (ref 19–32)
CREATININE: 1.1 mg/dL (ref 0.4–1.5)
Calcium: 8.9 mg/dL (ref 8.4–10.5)
GFR: 91.63 mL/min (ref >60.00)
Glucose, Bld: 74 mg/dL (ref 70–99)
Potassium: 3.6 mEq/L (ref 3.5–5.1)
Sodium: 139 mEq/L (ref 135–145)

## 2014-05-02 LAB — LIPID PANEL
Cholesterol: 187 mg/dL (ref 0–200)
HDL: 50.3 mg/dL (ref >39.00)
LDL CALC: 127 mg/dL — AB (ref 0–99)
NonHDL: 136.7
Total CHOL/HDL Ratio: 4
Triglycerides: 49 mg/dL (ref 0.0–149.0)
VLDL: 9.8 mg/dL (ref 0.0–40.0)

## 2014-05-02 LAB — HEPATIC FUNCTION PANEL
ALT: 23 U/L (ref 0–53)
AST: 18 U/L (ref 0–37)
Albumin: 3.7 g/dL (ref 3.5–5.2)
Alkaline Phosphatase: 70 U/L (ref 39–117)
BILIRUBIN DIRECT: 0 mg/dL (ref 0.0–0.3)
BILIRUBIN TOTAL: 0.5 mg/dL (ref 0.2–1.2)
TOTAL PROTEIN: 7.1 g/dL (ref 6.0–8.3)

## 2014-05-02 LAB — HEMOGLOBIN A1C: Hgb A1c MFr Bld: 6.2 % (ref 4.6–6.5)

## 2014-05-09 ENCOUNTER — Encounter: Payer: Self-pay | Admitting: Family Medicine

## 2014-05-09 ENCOUNTER — Ambulatory Visit (INDEPENDENT_AMBULATORY_CARE_PROVIDER_SITE_OTHER): Payer: Federal, State, Local not specified - PPO | Admitting: Family Medicine

## 2014-05-09 VITALS — BP 110/80 | Temp 98.3°F | Wt 289.0 lb

## 2014-05-09 DIAGNOSIS — E119 Type 2 diabetes mellitus without complications: Secondary | ICD-10-CM

## 2014-05-09 DIAGNOSIS — E785 Hyperlipidemia, unspecified: Secondary | ICD-10-CM

## 2014-05-09 DIAGNOSIS — I10 Essential (primary) hypertension: Secondary | ICD-10-CM

## 2014-05-09 MED ORDER — ATORVASTATIN CALCIUM 20 MG PO TABS: 20.0000 mg | ORAL_TABLET | Freq: Every day | ORAL | Status: DC

## 2014-05-09 NOTE — Progress Notes (Signed)
Pre visit review using our clinic review tool, if applicable. No additional management support is needed unless otherwise documented below in the visit note. Lab Results  Component Value Date   HGBA1C 6.2 05/02/2014   HGBA1C 6.3 11/03/2013   HGBA1C 9.6* 07/11/2013   Lab Results  Component Value Date   MICROALBUR 0.5 11/03/2013   LDLCALC 127* 05/02/2014   CREATININE 1.1 05/02/2014

## 2014-05-09 NOTE — Progress Notes (Signed)
   Subjective:    Patient ID: Leonard Rojas, male    DOB: 08-17-1966, 48 y.o.   MRN: [id number]  HPI [name] is a 48 year old married male nonsmoker who comes in today for followup of diabetes  He is on no medication for his diabetes he is controlled his blood sugar with diet and exercise. Hemoglobin A1c 10 months ago was 9.69 6.2%.  Blood pressure on lisinopril 5 mg daily 110/80  6 LDL on Lipitor 10 mg is 127. We'll try to drive his LDL down below 75 by increasing the Lipitor   Review of Systems    negative Objective:   Physical Exam  Well-developed and nourished male no acute distress vital signs stable he is afebrile weight 289 BP 110/80      Assessment & Plan:  Diabetes type 2 at goal with diet and exercise and no medication........... continue the above followup February for CPX  Hyperlipidemia increase Lipitor to 20 mg daily 6........... followup labs in February  Hypertension at goal continue current therapy.

## 2014-05-09 NOTE — Patient Instructions (Signed)
Continue your current medications except increase her Lipitor to 20 mg daily at bedtime  Followup the first week in February for your annual exam

## 2014-11-02 ENCOUNTER — Other Ambulatory Visit (INDEPENDENT_AMBULATORY_CARE_PROVIDER_SITE_OTHER): Payer: Federal, State, Local not specified - PPO

## 2014-11-02 DIAGNOSIS — E119 Type 2 diabetes mellitus without complications: Secondary | ICD-10-CM

## 2014-11-02 DIAGNOSIS — I10 Essential (primary) hypertension: Secondary | ICD-10-CM

## 2014-11-02 DIAGNOSIS — E785 Hyperlipidemia, unspecified: Secondary | ICD-10-CM

## 2014-11-02 LAB — POCT URINALYSIS DIPSTICK
Bilirubin, UA: NEGATIVE
Blood, UA: NEGATIVE
KETONES UA: NEGATIVE
Leukocytes, UA: NEGATIVE
Nitrite, UA: NEGATIVE
PH UA: 5
Protein, UA: NEGATIVE
SPEC GRAV UA: 1.02
Urobilinogen, UA: 0.2

## 2014-11-02 LAB — PSA: PSA: 0.68 ng/mL (ref 0.10–4.00)

## 2014-11-02 LAB — CBC WITH DIFFERENTIAL/PLATELET
BASOS ABS: 0 10*3/uL (ref 0.0–0.1)
Basophils Relative: 0.4 % (ref 0.0–3.0)
EOS PCT: 3.8 % (ref 0.0–5.0)
Eosinophils Absolute: 0.2 10*3/uL (ref 0.0–0.7)
HCT: 42 % (ref 39.0–52.0)
HEMOGLOBIN: 13.9 g/dL (ref 13.0–17.0)
LYMPHS ABS: 1.5 10*3/uL (ref 0.7–4.0)
Lymphocytes Relative: 26.3 % (ref 12.0–46.0)
MCHC: 33 g/dL (ref 30.0–36.0)
MCV: 87.7 fl (ref 78.0–100.0)
Monocytes Absolute: 0.5 10*3/uL (ref 0.1–1.0)
Monocytes Relative: 8.7 % (ref 3.0–12.0)
NEUTROS PCT: 60.8 % (ref 43.0–77.0)
Neutro Abs: 3.4 10*3/uL (ref 1.4–7.7)
Platelets: 236 10*3/uL (ref 150.0–400.0)
RBC: 4.79 Mil/uL (ref 4.22–5.81)
RDW: 14.5 % (ref 11.5–15.5)
WBC: 5.6 10*3/uL (ref 4.0–10.5)

## 2014-11-02 LAB — HEPATIC FUNCTION PANEL
ALBUMIN: 4.1 g/dL (ref 3.5–5.2)
ALK PHOS: 97 U/L (ref 39–117)
ALT: 23 U/L (ref 0–53)
AST: 14 U/L (ref 0–37)
BILIRUBIN TOTAL: 0.3 mg/dL (ref 0.2–1.2)
Bilirubin, Direct: 0.1 mg/dL (ref 0.0–0.3)
Total Protein: 7.1 g/dL (ref 6.0–8.3)

## 2014-11-02 LAB — LIPID PANEL
CHOL/HDL RATIO: 5
CHOLESTEROL: 216 mg/dL — AB (ref 0–200)
HDL: 44.6 mg/dL (ref >39.00)
LDL CALC: 144 mg/dL — AB (ref 0–99)
NonHDL: 171.4
Triglycerides: 135 mg/dL (ref 0.0–149.0)
VLDL: 27 mg/dL (ref 0.0–40.0)

## 2014-11-02 LAB — BASIC METABOLIC PANEL
BUN: 12 mg/dL (ref 6–23)
CHLORIDE: 100 meq/L (ref 96–112)
CO2: 30 mEq/L (ref 19–32)
CREATININE: 1.08 mg/dL (ref 0.40–1.50)
Calcium: 9.4 mg/dL (ref 8.4–10.5)
GFR: 93.4 mL/min (ref >60.00)
Glucose, Bld: 225 mg/dL — ABNORMAL HIGH (ref 70–99)
Potassium: 4.3 mEq/L (ref 3.5–5.1)
Sodium: 139 mEq/L (ref 135–145)

## 2014-11-02 LAB — MICROALBUMIN / CREATININE URINE RATIO
CREATININE, U: 252.9 mg/dL
MICROALB/CREAT RATIO: 0.3 mg/g (ref 0.0–30.0)
Microalb, Ur: 0.7 mg/dL (ref 0.0–1.9)

## 2014-11-02 LAB — HEMOGLOBIN A1C: HEMOGLOBIN A1C: 11.3 % — AB (ref 4.6–6.5)

## 2014-11-02 LAB — TSH: TSH: 4.3 u[IU]/mL (ref 0.35–4.50)

## 2014-11-09 ENCOUNTER — Encounter: Payer: Self-pay | Admitting: Family Medicine

## 2014-11-09 ENCOUNTER — Ambulatory Visit (INDEPENDENT_AMBULATORY_CARE_PROVIDER_SITE_OTHER): Payer: Federal, State, Local not specified - PPO | Admitting: Family Medicine

## 2014-11-09 VITALS — BP 120/80 | Temp 97.9°F | Ht 72.0 in | Wt 302.0 lb

## 2014-11-09 DIAGNOSIS — E785 Hyperlipidemia, unspecified: Secondary | ICD-10-CM

## 2014-11-09 DIAGNOSIS — I10 Essential (primary) hypertension: Secondary | ICD-10-CM

## 2014-11-09 DIAGNOSIS — E139 Other specified diabetes mellitus without complications: Secondary | ICD-10-CM

## 2014-11-09 MED ORDER — LISINOPRIL 5 MG PO TABS
5.0000 mg | ORAL_TABLET | Freq: Every day | ORAL | Status: DC
Start: 2014-11-09 — End: 2016-09-17

## 2014-11-09 MED ORDER — METFORMIN HCL 500 MG PO TABS: 500.0000 mg | ORAL_TABLET | Freq: Two times a day (BID) | ORAL | Status: DC

## 2014-11-09 MED ORDER — ATORVASTATIN CALCIUM 20 MG PO TABS: 20.0000 mg | ORAL_TABLET | Freq: Every day | ORAL | Status: DC

## 2014-11-09 NOTE — Progress Notes (Signed)
Pre visit review using our clinic review tool, if applicable. No additional management support is needed unless otherwise documented below in the visit note. Pre visit review using our clinic review tool, if applicable. No additional management support is needed unless otherwise documented below in the visit note. 

## 2014-11-09 NOTE — Progress Notes (Signed)
   Subjective:    Patient ID: Leonard Rojas, male    DOB: Apr 04, 1966, Apr 04, 1966, 49 y.o.   MRN: [id number]  HPI Leonard Rojas is a 49 year old married male nonsmoker who comes in today for general physical examination because of underlying diabetes  He's been diabetic for many years and was able to control his blood sugar with diet exercise and weight loss. This past year his weight went up to 302 pounds he's noticed she's had some blurred vision increased thirst and urination. What sugar in the 225 range A1c now up to 11.3.  He gets routine eye care, dental care, colonoscopy at age 83. No family history of bowel problems family history of polyps or cancer.  Vaccinations up-to-date    Review of Systems  Constitutional: Negative.   HENT: Negative.   Eyes: Negative.   Respiratory: Negative.   Cardiovascular: Negative.   Gastrointestinal: Negative.   Endocrine: Negative.   Genitourinary: Negative.   Musculoskeletal: Negative.   Skin: Negative.   Allergic/Immunologic: Negative.   Neurological: Negative.   Hematological: Negative.   Psychiatric/Behavioral: Negative.        Objective:   Physical Exam  Constitutional: He is oriented to person, place, and time. He appears well-developed and well-nourished.  HENT:  Head: Normocephalic and atraumatic.  Right Ear: External ear normal.  Left Ear: External ear normal.  Nose: Nose normal.  Mouth/Throat: Oropharynx is clear and moist.  Eyes: Conjunctivae and EOM are normal. Pupils are equal, round, and reactive to light.  Neck: Normal range of motion. Neck supple. No JVD present. No tracheal deviation present. No thyromegaly present.  Cardiovascular: Normal rate, regular rhythm, normal heart sounds and intact distal pulses.  Exam reveals no gallop and no friction rub.   No murmur heard. Pulmonary/Chest: Effort normal and breath sounds normal. No stridor. No respiratory distress. He has no wheezes. He has no rales. He exhibits no tenderness.    Abdominal: Soft. Bowel sounds are normal. He exhibits no distension and no mass. There is no tenderness. There is no rebound and no guarding.  Genitourinary: Rectum normal, prostate normal and penis normal. Guaiac negative stool. No penile tenderness.  Father developed prostate cancer at age 42 and is still alive  Musculoskeletal: Normal range of motion. He exhibits no edema or tenderness.  Lymphadenopathy:    He has no cervical adenopathy.  Neurological: He is alert and oriented to person, place, and time. He has normal reflexes. No cranial nerve deficit. He exhibits normal muscle tone.  No neuropathy  Skin: Skin is warm and dry. No rash noted. No erythema. No pallor.  Psychiatric: He has a normal mood and affect. His behavior is normal. Judgment and thought content normal.  Nursing note and vitals reviewed.         Assessment & Plan:  Healthy male  Diabetes2 not control with diet exercise and 80s gained about 50 pounds....... back on the diet exercise add metformin follow-up in one month  Overweight........ again stressed diet exercise and weight loss  Hypertension at goal on lisinopril 5 mg daily,,,,,,,,, also for renal protection  Elevated LDL cholesterol 144,,,,,,,,, recheck after blood sugar stabilizes it was normal in the past  Positive family history of prostate cancer,,,,,, father,,,,,,,, DRE yearly along with PSA

## 2014-11-09 NOTE — Patient Instructions (Signed)
Metformin 500 mg.....Marland Kitchen one before breakfast and one before your evening male  Drink lots of water  Back on the diet and exercise program  Fasting blood sugar daily in the morning  Return in 2 weeks for follow-up with all your blood sugar readings

## 2014-11-27 ENCOUNTER — Ambulatory Visit (INDEPENDENT_AMBULATORY_CARE_PROVIDER_SITE_OTHER): Payer: Federal, State, Local not specified - PPO | Admitting: Family Medicine

## 2014-11-27 ENCOUNTER — Encounter: Payer: Self-pay | Admitting: Family Medicine

## 2014-11-27 VITALS — BP 130/88 | Wt 302.0 lb

## 2014-11-27 DIAGNOSIS — F988 Other specified behavioral and emotional disorders with onset usually occurring in childhood and adolescence: Secondary | ICD-10-CM

## 2014-11-27 DIAGNOSIS — F909 Attention-deficit hyperactivity disorder, unspecified type: Secondary | ICD-10-CM

## 2014-11-27 DIAGNOSIS — E139 Other specified diabetes mellitus without complications: Secondary | ICD-10-CM

## 2014-11-27 MED ORDER — AMPHETAMINE-DEXTROAMPHETAMINE 10 MG PO TABS: 10.0000 mg | ORAL_TABLET | Freq: Two times a day (BID) | ORAL | Status: DC

## 2014-11-27 MED ORDER — PREDNISONE 20 MG PO TABS: ORAL_TABLET | ORAL | Status: DC

## 2014-11-27 MED ORDER — AMPHETAMINE-DEXTROAMPHETAMINE 10 MG PO TABS: 10.0000 mg | ORAL_TABLET | ORAL | Status: DC | PRN

## 2014-11-27 NOTE — Progress Notes (Signed)
   Subjective:    Patient ID: Leonard Rojas, male    DOB: 1966/08/02, 49 y.o.   MRN: [id number]  HPI Leonard Rojas is a 49 year old married male nonsmoker who comes in today for follow-up of diabetes  We saw him in early February for general physical examination at that time blood sugar was elevated A1c was 11.3%. He started back on his diet and exercise program. He's on metformin 500 mg twice a day blood sugars down to 100. Weight initially 302 today the same no weight loss   Review of Systems    review of systems negative no hypoglycemia Objective:   Physical Exam Well-developed well-nourished male no acute distress vital signs stable he is afebrile       Assessment & Plan:  Diabetes type 2 at goal,,,,,,,,,, continue diet exercise and medication follow-up in May

## 2014-11-27 NOTE — Patient Instructions (Signed)
Continue diet and exercise program  Check a fasting blood sugar Monday Wednesday Friday  Continue metformin 500 mg twice a day  Follow-up the second week in May  Fasting labs one week prior,,,,,,, fasting because were also on a repeat your lipid panel

## 2014-11-27 NOTE — Progress Notes (Signed)
Pre visit review using our clinic review tool, if applicable. No additional management support is needed unless otherwise documented below in the visit note. 

## 2015-01-31 ENCOUNTER — Other Ambulatory Visit: Payer: Federal, State, Local not specified - PPO

## 2015-02-06 ENCOUNTER — Ambulatory Visit: Payer: Federal, State, Local not specified - PPO | Admitting: Family Medicine

## 2015-03-27 ENCOUNTER — Telehealth: Payer: Self-pay | Admitting: *Deleted

## 2015-03-27 DIAGNOSIS — E139 Other specified diabetes mellitus without complications: Secondary | ICD-10-CM

## 2015-03-27 NOTE — Telephone Encounter (Signed)
Lab appointment made A1c ordered Diabetic bundle

## 2015-03-28 LAB — HM DIABETES EYE EXAM

## 2015-04-11 ENCOUNTER — Other Ambulatory Visit (INDEPENDENT_AMBULATORY_CARE_PROVIDER_SITE_OTHER): Payer: Federal, State, Local not specified - PPO

## 2015-04-11 DIAGNOSIS — E139 Other specified diabetes mellitus without complications: Secondary | ICD-10-CM | POA: Diagnosis not present

## 2015-04-11 LAB — HEMOGLOBIN A1C: Hgb A1c MFr Bld: 10.9 % — ABNORMAL HIGH (ref 4.6–6.5)

## 2015-04-26 ENCOUNTER — Encounter: Payer: Self-pay | Admitting: Family Medicine

## 2016-07-09 DIAGNOSIS — K080 Exfoliation of teeth due to systemic causes: Secondary | ICD-10-CM | POA: Diagnosis not present

## 2016-09-09 ENCOUNTER — Other Ambulatory Visit (INDEPENDENT_AMBULATORY_CARE_PROVIDER_SITE_OTHER): Payer: Federal, State, Local not specified - PPO

## 2016-09-09 DIAGNOSIS — Z0000 Encounter for general adult medical examination without abnormal findings: Secondary | ICD-10-CM

## 2016-09-09 LAB — TSH: TSH: 4.59 u[IU]/mL — AB (ref 0.35–4.50)

## 2016-09-09 LAB — CBC WITH DIFFERENTIAL/PLATELET
BASOS PCT: 0.2 % (ref 0.0–3.0)
Basophils Absolute: 0 10*3/uL (ref 0.0–0.1)
EOS ABS: 0.3 10*3/uL (ref 0.0–0.7)
Eosinophils Relative: 4.4 % (ref 0.0–5.0)
HEMATOCRIT: 42 % (ref 39.0–52.0)
HEMOGLOBIN: 13.8 g/dL (ref 13.0–17.0)
LYMPHS PCT: 25.1 % (ref 12.0–46.0)
Lymphs Abs: 1.6 10*3/uL (ref 0.7–4.0)
MCHC: 32.8 g/dL (ref 30.0–36.0)
MCV: 87.7 fl (ref 78.0–100.0)
Monocytes Absolute: 0.4 10*3/uL (ref 0.1–1.0)
Monocytes Relative: 6.5 % (ref 3.0–12.0)
Neutro Abs: 4.2 10*3/uL (ref 1.4–7.7)
Neutrophils Relative %: 63.8 % (ref 43.0–77.0)
Platelets: 220 10*3/uL (ref 150.0–400.0)
RBC: 4.78 Mil/uL (ref 4.22–5.81)
RDW: 14.6 % (ref 11.5–15.5)
WBC: 6.6 10*3/uL (ref 4.0–10.5)

## 2016-09-09 LAB — POC URINALSYSI DIPSTICK (AUTOMATED)
BILIRUBIN UA: NEGATIVE
Blood, UA: NEGATIVE
Ketones, UA: NEGATIVE
LEUKOCYTES UA: NEGATIVE
NITRITE UA: NEGATIVE
Spec Grav, UA: 1.025
UROBILINOGEN UA: 0.2
pH, UA: 5.5

## 2016-09-09 LAB — BASIC METABOLIC PANEL
BUN: 12 mg/dL (ref 6–23)
CHLORIDE: 100 meq/L (ref 96–112)
CO2: 29 mEq/L (ref 19–32)
CREATININE: 0.98 mg/dL (ref 0.40–1.50)
Calcium: 9 mg/dL (ref 8.4–10.5)
GFR: 103.7 mL/min (ref >60.00)
Glucose, Bld: 250 mg/dL — ABNORMAL HIGH (ref 70–99)
POTASSIUM: 4.1 meq/L (ref 3.5–5.1)
Sodium: 137 mEq/L (ref 135–145)

## 2016-09-09 LAB — MICROALBUMIN / CREATININE URINE RATIO
CREATININE, U: 246.9 mg/dL
MICROALB/CREAT RATIO: 0.3 mg/g (ref 0.0–30.0)

## 2016-09-09 LAB — LIPID PANEL
CHOL/HDL RATIO: 5
Cholesterol: 215 mg/dL — ABNORMAL HIGH (ref 0–200)
HDL: 42.8 mg/dL (ref >39.00)
LDL CALC: 150 mg/dL — AB (ref 0–99)
NONHDL: 171.98
Triglycerides: 110 mg/dL (ref 0.0–149.0)
VLDL: 22 mg/dL (ref 0.0–40.0)

## 2016-09-09 LAB — HEPATIC FUNCTION PANEL
ALK PHOS: 74 U/L (ref 39–117)
ALT: 19 U/L (ref 0–53)
AST: 13 U/L (ref 0–37)
Albumin: 3.9 g/dL (ref 3.5–5.2)
BILIRUBIN DIRECT: 0.1 mg/dL (ref 0.0–0.3)
TOTAL PROTEIN: 6.8 g/dL (ref 6.0–8.3)
Total Bilirubin: 0.3 mg/dL (ref 0.2–1.2)

## 2016-09-09 LAB — HEMOGLOBIN A1C: HEMOGLOBIN A1C: 12.6 % — AB (ref 4.6–6.5)

## 2016-09-09 LAB — PSA: PSA: 0.66 ng/mL (ref 0.10–4.00)

## 2016-09-16 ENCOUNTER — Encounter: Payer: Federal, State, Local not specified - PPO | Admitting: Family Medicine

## 2016-09-17 ENCOUNTER — Ambulatory Visit (INDEPENDENT_AMBULATORY_CARE_PROVIDER_SITE_OTHER): Payer: Federal, State, Local not specified - PPO | Admitting: Family Medicine

## 2016-09-17 ENCOUNTER — Other Ambulatory Visit: Payer: Self-pay | Admitting: Emergency Medicine

## 2016-09-17 ENCOUNTER — Encounter: Payer: Self-pay | Admitting: Family Medicine

## 2016-09-17 VITALS — BP 130/80 | HR 95 | Ht 72.0 in | Wt 297.4 lb

## 2016-09-17 DIAGNOSIS — Z8042 Family history of malignant neoplasm of prostate: Secondary | ICD-10-CM | POA: Diagnosis not present

## 2016-09-17 DIAGNOSIS — E7800 Pure hypercholesterolemia, unspecified: Secondary | ICD-10-CM

## 2016-09-17 DIAGNOSIS — I10 Essential (primary) hypertension: Secondary | ICD-10-CM | POA: Diagnosis not present

## 2016-09-17 DIAGNOSIS — F988 Other specified behavioral and emotional disorders with onset usually occurring in childhood and adolescence: Secondary | ICD-10-CM

## 2016-09-17 DIAGNOSIS — E139 Other specified diabetes mellitus without complications: Secondary | ICD-10-CM

## 2016-09-17 DIAGNOSIS — E109 Type 1 diabetes mellitus without complications: Secondary | ICD-10-CM

## 2016-09-17 MED ORDER — AMPHETAMINE-DEXTROAMPHETAMINE 10 MG PO TABS: 10.0000 mg | ORAL_TABLET | Freq: Two times a day (BID) | ORAL | 0 refills | Status: DC

## 2016-09-17 MED ORDER — AMPHETAMINE-DEXTROAMPHETAMINE 10 MG PO TABS
10.0000 mg | ORAL_TABLET | ORAL | 0 refills | Status: DC | PRN
Start: 2016-09-17 — End: 2024-08-11

## 2016-09-17 MED ORDER — AMPHETAMINE-DEXTROAMPHETAMINE 10 MG PO TABS
10.0000 mg | ORAL_TABLET | Freq: Two times a day (BID) | ORAL | 0 refills | Status: DC
Start: 2016-09-17 — End: 2018-01-13

## 2016-09-17 MED ORDER — METFORMIN HCL 1000 MG PO TABS: ORAL_TABLET | ORAL | 4 refills | Status: DC

## 2016-09-17 MED ORDER — GLUCOSE BLOOD VI STRP: ORAL_STRIP | 3 refills | Status: AC

## 2016-09-17 MED ORDER — LISINOPRIL 5 MG PO TABS: 5.0000 mg | ORAL_TABLET | Freq: Every day | ORAL | 3 refills | Status: DC

## 2016-09-17 NOTE — Progress Notes (Signed)
Leonard Rojas is a 50 year old married male nonsmoker... Physical therapist by trade.... Who comes in today for general physical examination because of a history of obesity diabetes hypertension sleep apnea  He's been noncompliant with his diet exercise and he's not been taking his medication. Blood sugar 8 days ago fasting to 50 A1c 12.6%. We will begin to work with him again as we have in the past however at this juncture I think we need some help from Dr. Lorie Apley  He takes Adderall 10 mg daily when necessary for adult ADD. He stopped taking the Lipitor because he had severe muscle pain and he couldn't stand it was so bad.  He takes lisinopril 5 mg daily for blood pressure control BP 130/80  His metformin dose is 500 mg twice a day but he has not been taking it.  He gets routine eye care, dental care, do a colonoscopy.  Vaccinations up-to-date  No neuropathy  14 point review of systems June otherwise negative  BP 130/80 (BP Location: Left Arm, Patient Position: Sitting, Cuff Size: Large)   Pulse 95   Ht 6' (1.829 m)   Wt 297 lb 6.4 oz (134.9 kg)   BMI 40.33 kg/m  Examination of the HEENT were negative neck was supple no adenopathy thyroid normal cardiopulmonary exam normal abdominal exam normal genitalia normal circumcised male rectum normal stool guaiac-negative prostate normal extremity is normal skin normal peripheral pulses normal  #1 diabetes out of control......Marland Kitchen Diet...Marland KitchenMarland KitchenMarland Kitchen Exercise...Marland KitchenMarland KitchenMarland Kitchen weight loss metformin 1000 mg twice a day, fasting blood sugar daily, consult with Dr. Lorie Apley ASAP  #2 obesity..... Again stressed diet exercise and weight loss  #3 hypertension at goal..... Continue current medication lisinopril 5 mg daily  Hyperlipidemia........ side effects from statins severe muscle pain  #5 adult ADD..... Adderall 10 mg when necessary  Family history of prostate cancer...Marland KitchenMarland KitchenMarland Kitchen Father.. Yearly DRE and PSA

## 2016-09-17 NOTE — Patient Instructions (Signed)
Restart your carbohydrate free diet  Walk 30 minutes daily  Metformin 1000 mg.....Marland Kitchen one before breakfast 14 your evening meal  Consult with Dr. Lorie Apley ASAP  Colonoscopy and GI

## 2016-09-17 NOTE — Progress Notes (Signed)
Pre visit review using our clinic review tool, if applicable. No additional management support is needed unless otherwise documented below in the visit note. 

## 2016-09-18 ENCOUNTER — Telehealth: Payer: Self-pay

## 2016-09-18 NOTE — Telephone Encounter (Signed)
PA approved, form faxed back to pharmacy. 

## 2016-09-18 NOTE — Telephone Encounter (Signed)
Received PA request from [facility] in [place] for Adderall. PA submitted & is pending. Key: [id number]

## 2016-10-02 ENCOUNTER — Encounter: Payer: Self-pay | Admitting: Internal Medicine

## 2016-11-12 ENCOUNTER — Encounter: Payer: Self-pay | Admitting: Family Medicine

## 2016-11-12 ENCOUNTER — Ambulatory Visit (INDEPENDENT_AMBULATORY_CARE_PROVIDER_SITE_OTHER): Payer: Federal, State, Local not specified - PPO | Admitting: Family Medicine

## 2016-11-12 ENCOUNTER — Telehealth: Payer: Self-pay | Admitting: Emergency Medicine

## 2016-11-12 ENCOUNTER — Ambulatory Visit (INDEPENDENT_AMBULATORY_CARE_PROVIDER_SITE_OTHER)
Admission: RE | Admit: 2016-11-12 | Discharge: 2016-11-12 | Disposition: A | Discharge status: Home / Self Care | Payer: Federal, State, Local not specified - PPO | Source: Ambulatory Visit | Attending: Family Medicine | Admitting: Family Medicine

## 2016-11-12 VITALS — BP 130/88 | HR 88 | Temp 97.6°F

## 2016-11-12 DIAGNOSIS — Z23 Encounter for immunization: Secondary | ICD-10-CM

## 2016-11-12 DIAGNOSIS — Z9289 Personal history of other medical treatment: Secondary | ICD-10-CM

## 2016-11-12 DIAGNOSIS — R7611 Nonspecific reaction to tuberculin skin test without active tuberculosis: Secondary | ICD-10-CM

## 2016-11-12 DIAGNOSIS — E139 Other specified diabetes mellitus without complications: Secondary | ICD-10-CM

## 2016-11-12 DIAGNOSIS — E109 Type 1 diabetes mellitus without complications: Secondary | ICD-10-CM

## 2016-11-12 NOTE — Progress Notes (Signed)
Pre visit review using our clinic review tool, if applicable. No additional management support is needed unless otherwise documented below in the visit note. 

## 2016-11-12 NOTE — Progress Notes (Signed)
Leonard Rojas is a 51 year old married male nonsmoker who comes in today to get a chest x-ray  In 1993 he was given a PPD in [facility] and was positive. He took [place] for 6 months. Chest x-ray at that time was negative.  In the interim he's been asymptomatic.  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 A 1 to have a chest x-ray.  Again he's asymptomatic  Also do a flu shot  He is on metformin 1000 mg twice a day. Last A1c one C was 12. He's back on his diet and exercise program. Follow-up A1c is ordered. This is happened before. He knows what to do to get his blood sugar down to normal

## 2016-11-12 NOTE — Telephone Encounter (Signed)
Called pt to inform him that xray report has been faxed over to [facility] and a record that he received the flu vaccination today in the office as been faxed also.

## 2016-11-12 NOTE — Patient Instructions (Signed)
Go to the main office now for your chest x-ray  Continue diet exercise program. Follow-up A1c as outlined

## 2016-11-17 ENCOUNTER — Encounter: Payer: Self-pay | Admitting: Family Medicine

## 2017-01-14 ENCOUNTER — Other Ambulatory Visit (INDEPENDENT_AMBULATORY_CARE_PROVIDER_SITE_OTHER): Payer: Federal, State, Local not specified - PPO

## 2017-01-14 DIAGNOSIS — E109 Type 1 diabetes mellitus without complications: Secondary | ICD-10-CM

## 2017-01-14 DIAGNOSIS — E139 Other specified diabetes mellitus without complications: Secondary | ICD-10-CM

## 2017-01-14 LAB — HEMOGLOBIN A1C: HEMOGLOBIN A1C: 6.8 % — AB (ref 4.6–6.5)

## 2017-01-20 DIAGNOSIS — K080 Exfoliation of teeth due to systemic causes: Secondary | ICD-10-CM | POA: Diagnosis not present

## 2017-05-27 DIAGNOSIS — E119 Type 2 diabetes mellitus without complications: Secondary | ICD-10-CM | POA: Diagnosis not present

## 2017-07-22 DIAGNOSIS — K080 Exfoliation of teeth due to systemic causes: Secondary | ICD-10-CM | POA: Diagnosis not present

## 2017-11-04 ENCOUNTER — Encounter: Payer: Self-pay | Admitting: Family Medicine

## 2017-11-04 ENCOUNTER — Ambulatory Visit (INDEPENDENT_AMBULATORY_CARE_PROVIDER_SITE_OTHER): Payer: Federal, State, Local not specified - PPO | Admitting: Family Medicine

## 2017-11-04 VITALS — BP 120/80 | HR 84 | Temp 97.9°F | Ht 72.0 in | Wt 287.0 lb

## 2017-11-04 DIAGNOSIS — G4733 Obstructive sleep apnea (adult) (pediatric): Secondary | ICD-10-CM | POA: Diagnosis not present

## 2017-11-04 DIAGNOSIS — Z23 Encounter for immunization: Secondary | ICD-10-CM

## 2017-11-04 DIAGNOSIS — E7800 Pure hypercholesterolemia, unspecified: Secondary | ICD-10-CM | POA: Diagnosis not present

## 2017-11-04 DIAGNOSIS — Z8371 Family history of colonic polyps: Secondary | ICD-10-CM | POA: Diagnosis not present

## 2017-11-04 DIAGNOSIS — Z0001 Encounter for general adult medical examination with abnormal findings: Secondary | ICD-10-CM

## 2017-11-04 DIAGNOSIS — I10 Essential (primary) hypertension: Secondary | ICD-10-CM | POA: Diagnosis not present

## 2017-11-04 DIAGNOSIS — Z8042 Family history of malignant neoplasm of prostate: Secondary | ICD-10-CM | POA: Diagnosis not present

## 2017-11-04 DIAGNOSIS — E109 Type 1 diabetes mellitus without complications: Secondary | ICD-10-CM

## 2017-11-04 DIAGNOSIS — E139 Other specified diabetes mellitus without complications: Secondary | ICD-10-CM

## 2017-11-04 LAB — BASIC METABOLIC PANEL
BUN: 19 mg/dL (ref 6–23)
CALCIUM: 9.1 mg/dL (ref 8.4–10.5)
CHLORIDE: 103 meq/L (ref 96–112)
CO2: 30 meq/L (ref 19–32)
Creatinine, Ser: 1.05 mg/dL (ref 0.40–1.50)
GFR: 95.33 mL/min (ref >60.00)
Glucose, Bld: 107 mg/dL — ABNORMAL HIGH (ref 70–99)
POTASSIUM: 4.1 meq/L (ref 3.5–5.1)
SODIUM: 140 meq/L (ref 135–145)

## 2017-11-04 LAB — CBC WITH DIFFERENTIAL/PLATELET
BASOS PCT: 0.4 % (ref 0.0–3.0)
Basophils Absolute: 0 10*3/uL (ref 0.0–0.1)
EOS ABS: 0.1 10*3/uL (ref 0.0–0.7)
EOS PCT: 3.2 % (ref 0.0–5.0)
HCT: 41.6 % (ref 39.0–52.0)
Hemoglobin: 13.6 g/dL (ref 13.0–17.0)
LYMPHS ABS: 1 10*3/uL (ref 0.7–4.0)
Lymphocytes Relative: 22.5 % (ref 12.0–46.0)
MCHC: 32.7 g/dL (ref 30.0–36.0)
MCV: 89.1 fl (ref 78.0–100.0)
MONO ABS: 0.4 10*3/uL (ref 0.1–1.0)
Monocytes Relative: 9.7 % (ref 3.0–12.0)
NEUTROS PCT: 64.2 % (ref 43.0–77.0)
Neutro Abs: 2.9 10*3/uL (ref 1.4–7.7)
Platelets: 232 10*3/uL (ref 150.0–400.0)
RBC: 4.67 Mil/uL (ref 4.22–5.81)
RDW: 15.3 % (ref 11.5–15.5)
WBC: 4.5 10*3/uL (ref 4.0–10.5)

## 2017-11-04 LAB — POCT URINALYSIS DIPSTICK
Bilirubin, UA: NEGATIVE
GLUCOSE UA: NEGATIVE
Leukocytes, UA: NEGATIVE
Nitrite, UA: NEGATIVE
Protein, UA: NEGATIVE
RBC UA: NEGATIVE
Spec Grav, UA: >=1.03 — AB (ref 1.010–1.025)
UROBILINOGEN UA: 0.2 U/dL
pH, UA: 6 (ref 5.0–8.0)

## 2017-11-04 LAB — LIPID PANEL
CHOLESTEROL: 259 mg/dL — AB (ref 0–200)
HDL: 32.3 mg/dL — ABNORMAL LOW (ref >39.00)
LDL CALC: 206 mg/dL — AB (ref 0–99)
NonHDL: 226.85
Total CHOL/HDL Ratio: 8
Triglycerides: 105 mg/dL (ref 0.0–149.0)
VLDL: 21 mg/dL (ref 0.0–40.0)

## 2017-11-04 LAB — MICROALBUMIN / CREATININE URINE RATIO
Creatinine,U: 194 mg/dL
MICROALB/CREAT RATIO: 0.4 mg/g (ref 0.0–30.0)
Microalb, Ur: <0.7 mg/dL (ref 0.0–1.9)

## 2017-11-04 LAB — HEPATIC FUNCTION PANEL
ALT: 23 U/L (ref 0–53)
AST: 20 U/L (ref 0–37)
Albumin: 4.1 g/dL (ref 3.5–5.2)
Alkaline Phosphatase: 54 U/L (ref 39–117)
BILIRUBIN DIRECT: 0.1 mg/dL (ref 0.0–0.3)
BILIRUBIN TOTAL: 0.4 mg/dL (ref 0.2–1.2)
Total Protein: 7.2 g/dL (ref 6.0–8.3)

## 2017-11-04 LAB — HEMOGLOBIN A1C: Hgb A1c MFr Bld: 9.7 % — ABNORMAL HIGH (ref 4.6–6.5)

## 2017-11-04 LAB — TSH: TSH: 3.43 u[IU]/mL (ref 0.35–4.50)

## 2017-11-04 LAB — PSA: PSA: 0.73 ng/mL (ref 0.10–4.00)

## 2017-11-04 NOTE — Progress Notes (Signed)
Leonard Rojas is a 52 year old married male nonsmoker who comes in today for general physical examination because of a history of diabetes and obesity  His weight is down 10 pounds in the last 6 weeks. He's gone on a ketogenic diet. We will his diet he stopped his metformin. His blood sugars at home are in the 89 range.  He takes lisinopril 5 mg daily for renal protection because of the diabetes  He has a history of adult ADD nonhyperactive type. He's off of his Adderall.  He gets routine eye care, dental care, colonoscopy due. His father had history of colon polyps.  Vaccinations seasonal flu shot given today.  14 point review of systems otherwise negative EKG was done because of a history of diabetes. 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 Normajean Baxter many years ago for sleep apnea. He only uses his CPAP now when necessary. He still longer seeing pulmonary.  BP 120/80 (BP Location: Right Arm, Patient Position: Sitting, Cuff Size: Large)   Pulse 84   Temp 97.9 F (36.6 C) (Oral)   Ht 6' (1.829 m)   Wt 287 lb (130.2 kg)   BMI 38.92 kg/m  Well-developed well-nourished male no acute distress HEENT were negative neck was supple no adenopathy thyroid normal no carotid bruits cardiopulmonary exam normal abdominal exam normal except for an obese abdomen. Extremities normal skin normal peripheral pulses normal  #1 diabetes type 2............ normal blood sugar off all medications on a strict diet........ check labs  #2 obesity............ continue diet exercise and weight loss  #3 sleep apnea............Marland Kitchen recommend reconsultation with pulmonary.  4..... Family history colon polyps dad............ colonoscopy  #5 family history of prostate cancer........Marland Kitchen monitor PSA

## 2017-11-04 NOTE — Patient Instructions (Signed)
Labs today..........Marland Kitchen we will call you and let you know when you do for follow-up based on your A1c etc.  Continue current diet...........Marland Kitchen remember to walk 30 minutes daily  I would recommend you reconsult with pulmonary regarding your sleep apnea

## 2017-11-10 ENCOUNTER — Other Ambulatory Visit: Payer: Self-pay

## 2017-11-10 DIAGNOSIS — R7309 Other abnormal glucose: Secondary | ICD-10-CM

## 2017-11-27 ENCOUNTER — Encounter: Payer: Self-pay | Admitting: Gastroenterology

## 2018-01-12 ENCOUNTER — Other Ambulatory Visit (INDEPENDENT_AMBULATORY_CARE_PROVIDER_SITE_OTHER): Payer: Federal, State, Local not specified - PPO

## 2018-01-12 DIAGNOSIS — R7309 Other abnormal glucose: Secondary | ICD-10-CM | POA: Diagnosis not present

## 2018-01-12 LAB — HEMOGLOBIN A1C: HEMOGLOBIN A1C: 6.2 % (ref 4.6–6.5)

## 2018-01-13 ENCOUNTER — Other Ambulatory Visit: Payer: Self-pay | Admitting: Family Medicine

## 2018-01-13 ENCOUNTER — Other Ambulatory Visit: Payer: Self-pay

## 2018-01-13 ENCOUNTER — Ambulatory Visit (AMBULATORY_SURGERY_CENTER): Payer: Self-pay | Admitting: *Deleted

## 2018-01-13 VITALS — Ht 71.0 in | Wt 276.6 lb

## 2018-01-13 DIAGNOSIS — Z1211 Encounter for screening for malignant neoplasm of colon: Secondary | ICD-10-CM

## 2018-01-13 DIAGNOSIS — R7309 Other abnormal glucose: Secondary | ICD-10-CM

## 2018-01-13 MED ORDER — PEG 3350-KCL-NA BICARB-NACL 420 G PO SOLR
4000.0000 mL | Freq: Once | ORAL | 0 refills | Status: AC
Start: 2018-01-13 — End: 2018-01-13

## 2018-01-13 NOTE — Progress Notes (Signed)
Patient denies any allergies to eggs or soy. Patient denies any problems with anesthesia/sedation. Patient denies any oxygen use at home. Patient denies taking any diet/weight loss medications or blood thinners. EMMI education assisgned to patient on colonoscopy, this was explained and instructions given to patient. 

## 2018-01-22 ENCOUNTER — Encounter: Payer: Self-pay | Admitting: Gastroenterology

## 2018-01-27 DIAGNOSIS — K080 Exfoliation of teeth due to systemic causes: Secondary | ICD-10-CM | POA: Diagnosis not present

## 2018-02-05 ENCOUNTER — Ambulatory Visit (AMBULATORY_SURGERY_CENTER): Payer: Federal, State, Local not specified - PPO | Admitting: Gastroenterology

## 2018-02-05 ENCOUNTER — Other Ambulatory Visit: Payer: Self-pay

## 2018-02-05 ENCOUNTER — Encounter: Payer: Self-pay | Admitting: Gastroenterology

## 2018-02-05 VITALS — BP 119/65 | HR 71 | Temp 98.0°F | Resp 10 | Ht 71.0 in | Wt 276.0 lb

## 2018-02-05 DIAGNOSIS — K6289 Other specified diseases of anus and rectum: Secondary | ICD-10-CM

## 2018-02-05 DIAGNOSIS — K629 Disease of anus and rectum, unspecified: Secondary | ICD-10-CM

## 2018-02-05 DIAGNOSIS — Z1211 Encounter for screening for malignant neoplasm of colon: Secondary | ICD-10-CM

## 2018-02-05 DIAGNOSIS — D123 Benign neoplasm of transverse colon: Secondary | ICD-10-CM

## 2018-02-05 MED ORDER — SODIUM CHLORIDE 0.9 % IV SOLN: 500.0000 mL | Freq: Once | INTRAVENOUS | Status: DC

## 2018-02-05 NOTE — Patient Instructions (Signed)
YOU HAD AN ENDOSCOPIC PROCEDURE TODAY AT THE [facility]:   Refer to the procedure report that was given to you for any specific questions about what was found during the examination.  If the procedure report does not answer your questions, please call your gastroenterologist to clarify.  If you requested that your care partner not be given the details of your procedure findings, then the procedure report has been included in a sealed envelope for you to review at your convenience later.  YOU SHOULD EXPECT: Some feelings of bloating in the abdomen. Passage of more gas than usual.  Walking can help get rid of the air that was put into your GI tract during the procedure and reduce the bloating. If you had a lower endoscopy (such as a colonoscopy or flexible sigmoidoscopy) you may notice spotting of blood in your stool or on the toilet paper. If you underwent a bowel prep for your procedure, you may not have a normal bowel movement for a few days.  Please Note:  You might notice some irritation and congestion in your nose or some drainage.  This is from the oxygen used during your procedure.  There is no need for concern and it should clear up in a day or so.  SYMPTOMS TO REPORT IMMEDIATELY:   Following lower endoscopy (colonoscopy or flexible sigmoidoscopy):  Excessive amounts of blood in the stool  Significant tenderness or worsening of abdominal pains  Swelling of the abdomen that is new, acute  Fever of 100F or higher  For urgent or emergent issues, a gastroenterologist can be reached at any hour by calling [phone].   DIET:  We do recommend a small meal at first, but then you may proceed to your regular diet.  Drink plenty of fluids but you should avoid alcoholic beverages for 24 hours.  ACTIVITY:  You should plan to take it easy for the rest of today and you should NOT DRIVE or use heavy machinery until tomorrow (because of the sedation medicines used during the test).     FOLLOW UP: Our staff will call the number listed on your records the next business day following your procedure to check on you and address any questions or concerns that you may have regarding the information given to you following your procedure. If we do not reach you, we will leave a message.  However, if you are feeling well and you are not experiencing any problems, there is no need to return our call.  We will assume that you have returned to your regular daily activities without incident.  If any biopsies were taken you will be contacted by phone or by letter within the next 1-3 weeks.  Please call us at [phone] if you have not heard about the biopsies in 3 weeks.   Await for biopsy results Polyps (handout given)    SIGNATURES/CONFIDENTIALITY: You and/or your care partner have signed paperwork which will be entered into your electronic medical record.  These signatures attest to the fact that that the information above on your After Visit Summary has been reviewed and is understood.  Full responsibility of the confidentiality of this discharge information lies with you and/or your care-partner. 

## 2018-02-05 NOTE — Op Note (Signed)
[name]: Leonard Rojas Procedure Date: 02/05/2018 8:47 AM MRN: [id number] Endoscopist: Milus Banister , MD Age: 52 Referring MD:  Date of Birth: 1966-04-08 Gender: Male Account #: [id number] Procedure:                Colonoscopy Indications:              Screening for colorectal malignant neoplasm Medicines:                Monitored Anesthesia Care Procedure:                Pre-Anesthesia Assessment:                           - Prior to the procedure, a History and Physical                            was performed, and patient medications and                            allergies were reviewed. The patient's tolerance of                            previous anesthesia was also reviewed. The risks                            and benefits of the procedure and the sedation                            options and risks were discussed with the patient.                            All questions were answered, and informed consent                            was obtained. Prior Anticoagulants: The patient has                            taken no previous anticoagulant or antiplatelet                            agents. ASA Grade Assessment: III - A patient with                            severe systemic disease. After reviewing the risks                            and benefits, the patient was deemed in                            satisfactory condition to undergo the procedure.                           After obtaining informed consent, the colonoscope  was passed under direct vision. Throughout the                            procedure, the patient's blood pressure, pulse, and                            oxygen saturations were monitored continuously. The                            Colonoscope was introduced through the anus and                            advanced to the the cecum, identified by                            appendiceal orifice and  ileocecal valve. The                            colonoscopy was performed without difficulty. The                            patient tolerated the procedure well. The quality                            of the bowel preparation was excellent. The                            ileocecal valve, appendiceal orifice, and rectum                            were photographed. Scope In: 9:09:23 AM Scope Out: 9:21:27 AM Scope Withdrawal Time: 0 hours 8 minutes 39 seconds  Total Procedure Duration: 0 hours 12 minutes 4 seconds  Findings:                 A 2 mm polyp was found in the transverse colon. The                            polyp was sessile. The polyp was removed with a                            cold snare. Resection and retrieval were complete.                           An area of mildly erythematous mucosa was found in                            the rectum. Biopsies were taken with a cold forceps                            for histology.                           The exam was otherwise without abnormality on  direct and retroflexion views. Complications:            No immediate complications. Estimated blood loss:                            None. Estimated Blood Loss:     Estimated blood loss was minimal. Impression:               - One 2 mm polyp in the transverse colon, removed                            with a cold snare. Resected and retrieved.                           - Erythematous mucosa in the rectum (prep effect?).                            Biopsied.                           - The examination was otherwise normal on direct                            and retroflexion views. Recommendation:           - Patient has a contact number available for                            emergencies. The signs and symptoms of potential                            delayed complications were discussed with the                            patient. Return to normal activities  tomorrow.                            Written discharge instructions were provided to the                            patient.                           - Resume previous diet.                           - Continue present medications.                           You will receive a letter within 2-3 weeks with the                            pathology results and my final recommendations.                           If the polyp(s) is proven to be 'pre-cancerous' on  pathology, you will need repeat colonoscopy in 5                            years. If the polyp(s) is NOT 'precancerous' on                            pathology then you should repeat colon cancer                            screening in 10 years with colonoscopy without need                            for colon cancer screening by any method prior to                            then (including stool testing). Milus Banister, MD 02/05/2018 9:25:25 AM This report has been signed electronically.

## 2018-02-05 NOTE — Progress Notes (Signed)
Pt's states no medical or surgical changes since previsit or office visit. 

## 2018-02-05 NOTE — Progress Notes (Signed)
Called to room to assist during endoscopic procedure.  Patient ID and intended procedure confirmed with present staff. Received instructions for my participation in the procedure from the performing physician.  

## 2018-02-05 NOTE — Progress Notes (Signed)
Report given to PACU, vss 

## 2018-02-08 ENCOUNTER — Telehealth: Payer: Self-pay | Admitting: *Deleted

## 2018-02-08 NOTE — Telephone Encounter (Signed)
  Follow up Call-  Call back number 02/05/2018  Post procedure Call Back phone  # [phone]  Permission to leave phone message Yes  Some recent data might be hidden     Patient questions:  Do you have a fever, pain , or abdominal swelling? No. Pain Score  0 *  Have you tolerated food without any problems? Yes.    Have you been able to return to your normal activities? Yes.    Do you have any questions about your discharge instructions: Diet   No. Medications  No. Follow up visit  No.  Do you have questions or concerns about your Care? No.  Actions: * If pain score is 4 or above: No action needed, pain <4.

## 2018-02-11 ENCOUNTER — Encounter: Payer: Self-pay | Admitting: Gastroenterology

## 2018-02-17 DIAGNOSIS — K080 Exfoliation of teeth due to systemic causes: Secondary | ICD-10-CM | POA: Diagnosis not present

## 2018-03-22 IMAGING — DX DG CHEST 2V
2 series · 2 of 2 positions shown · non-contrast
Comparison: None.

CLINICAL DATA: Positive PPD, no chest complaints

EXAM:
CHEST  2 VIEW

[chest pa]
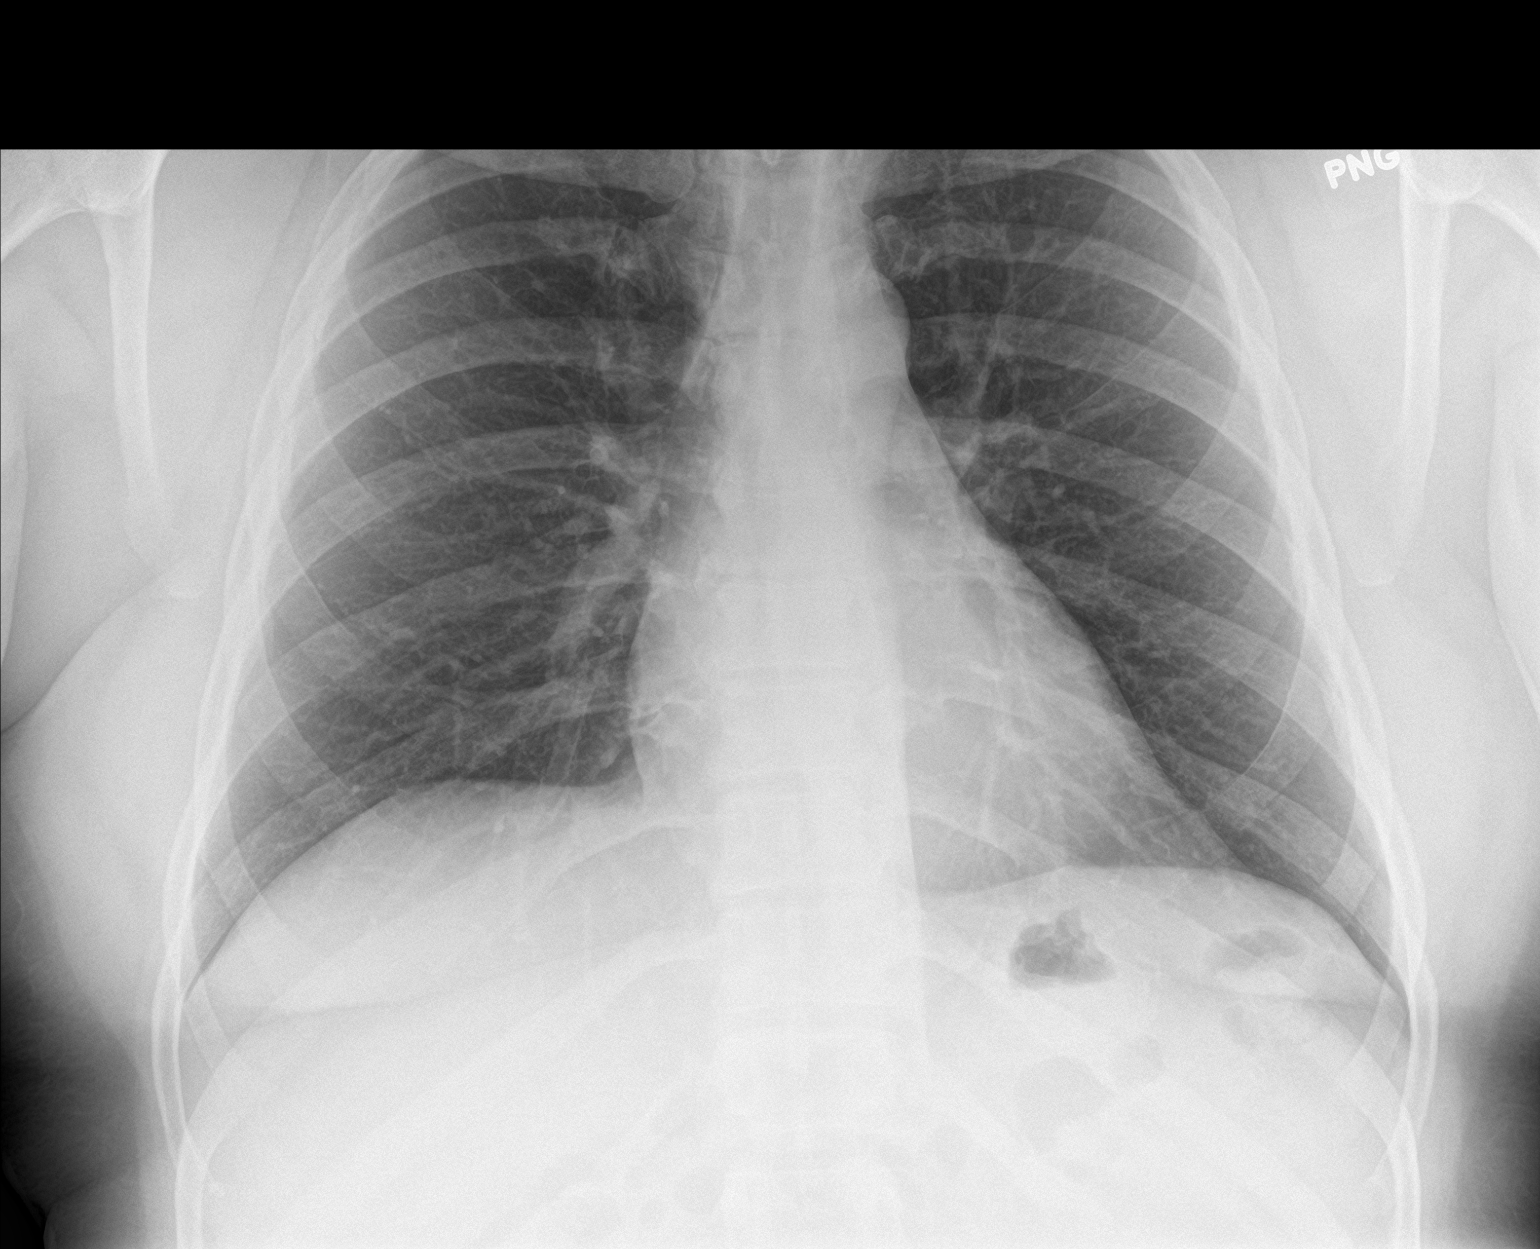

[chest lat]
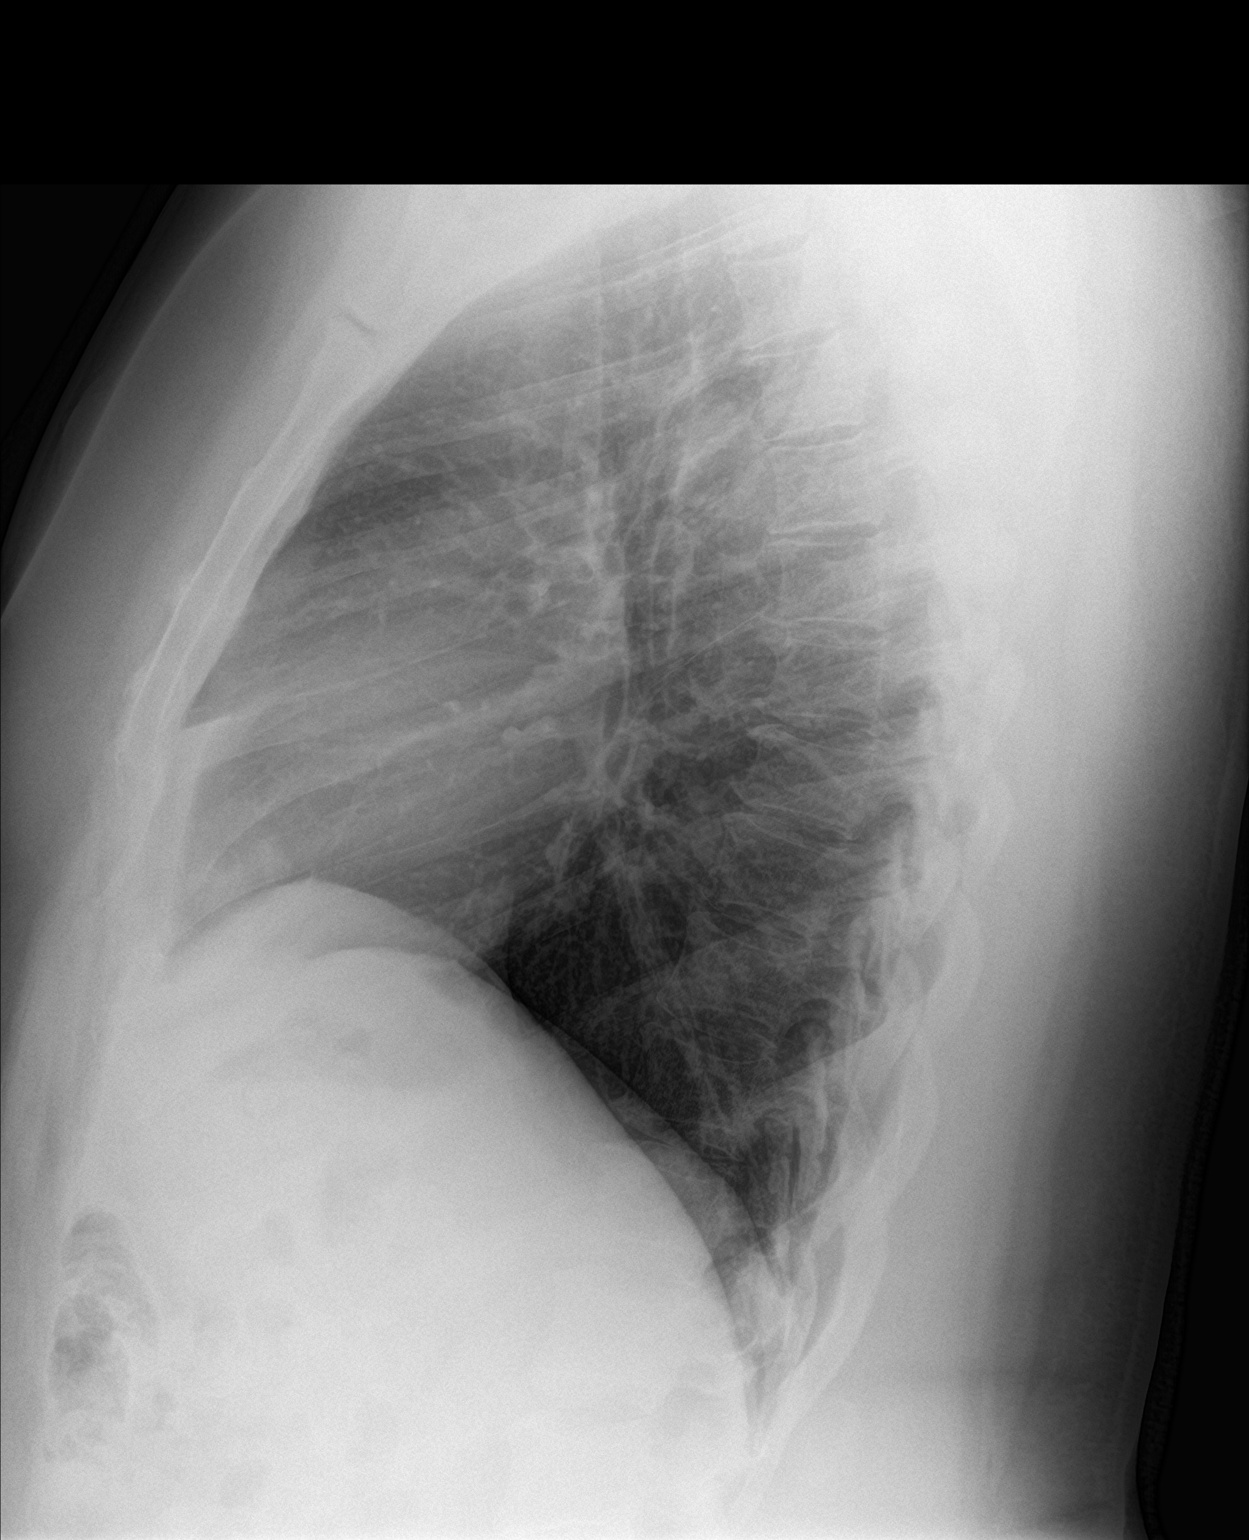

[2 of 2 positions shown; findings below may reference images not displayed]

FINDINGS: Lungs are clear.  No pleural effusion or pneumothorax.

The heart is normal in size.

Mild degenerative changes of the visualized thoracolumbar spine.
IMPRESSION: Normal chest radiographs.

## 2018-09-09 ENCOUNTER — Encounter: Payer: Self-pay | Admitting: Family Medicine

## 2018-09-09 ENCOUNTER — Ambulatory Visit: Payer: Federal, State, Local not specified - PPO | Admitting: Family Medicine

## 2018-09-09 VITALS — BP 112/80 | HR 78 | Temp 98.4°F | Wt 283.0 lb

## 2018-09-09 DIAGNOSIS — R1010 Upper abdominal pain, unspecified: Secondary | ICD-10-CM | POA: Diagnosis not present

## 2018-09-09 DIAGNOSIS — I10 Essential (primary) hypertension: Secondary | ICD-10-CM | POA: Diagnosis not present

## 2018-09-09 DIAGNOSIS — E782 Mixed hyperlipidemia: Secondary | ICD-10-CM

## 2018-09-09 DIAGNOSIS — E139 Other specified diabetes mellitus without complications: Secondary | ICD-10-CM

## 2018-09-09 LAB — COMPREHENSIVE METABOLIC PANEL
ALT: 16 U/L (ref 0–53)
AST: 14 U/L (ref 0–37)
Albumin: 4.3 g/dL (ref 3.5–5.2)
Alkaline Phosphatase: 65 U/L (ref 39–117)
BUN: 12 mg/dL (ref 6–23)
CO2: 33 mEq/L — ABNORMAL HIGH (ref 19–32)
Calcium: 9.6 mg/dL (ref 8.4–10.5)
Chloride: 103 mEq/L (ref 96–112)
Creatinine, Ser: 1.06 mg/dL (ref 0.40–1.50)
GFR: 93.99 mL/min (ref >60.00)
Glucose, Bld: 96 mg/dL (ref 70–99)
Potassium: 4.7 mEq/L (ref 3.5–5.1)
Sodium: 140 mEq/L (ref 135–145)
Total Bilirubin: 0.3 mg/dL (ref 0.2–1.2)
Total Protein: 7.2 g/dL (ref 6.0–8.3)

## 2018-09-09 LAB — LIPID PANEL
CHOL/HDL RATIO: 4
Cholesterol: 201 mg/dL — ABNORMAL HIGH (ref 0–200)
HDL: 55.2 mg/dL (ref >39.00)
LDL CALC: 136 mg/dL — AB (ref 0–99)
NONHDL: 145.74
TRIGLYCERIDES: 47 mg/dL (ref 0.0–149.0)
VLDL: 9.4 mg/dL (ref 0.0–40.0)

## 2018-09-09 LAB — CBC WITH DIFFERENTIAL/PLATELET
Basophils Absolute: 0 10*3/uL (ref 0.0–0.1)
Basophils Relative: 0.4 % (ref 0.0–3.0)
Eosinophils Absolute: 0.2 10*3/uL (ref 0.0–0.7)
Eosinophils Relative: 4.1 % (ref 0.0–5.0)
HCT: 43.3 % (ref 39.0–52.0)
Hemoglobin: 14.1 g/dL (ref 13.0–17.0)
Lymphocytes Relative: 26.5 % (ref 12.0–46.0)
Lymphs Abs: 1.4 10*3/uL (ref 0.7–4.0)
MCHC: 32.7 g/dL (ref 30.0–36.0)
MCV: 89.3 fl (ref 78.0–100.0)
Monocytes Absolute: 0.4 10*3/uL (ref 0.1–1.0)
Monocytes Relative: 7.3 % (ref 3.0–12.0)
Neutro Abs: 3.3 10*3/uL (ref 1.4–7.7)
Neutrophils Relative %: 61.7 % (ref 43.0–77.0)
Platelets: 257 10*3/uL (ref 150.0–400.0)
RBC: 4.85 Mil/uL (ref 4.22–5.81)
RDW: 15.2 % (ref 11.5–15.5)
WBC: 5.3 10*3/uL (ref 4.0–10.5)

## 2018-09-09 LAB — LIPASE: Lipase: 42 U/L (ref 11.0–59.0)

## 2018-09-09 LAB — HEMOGLOBIN A1C: Hgb A1c MFr Bld: 6.5 % (ref 4.6–6.5)

## 2018-09-09 NOTE — Patient Instructions (Signed)
Abdominal Pain, Adult Many things can cause belly (abdominal) pain. Most times, belly pain is not dangerous. Many cases of belly pain can be watched and treated at home. Sometimes belly pain is serious, though. Your doctor will try to find the cause of your belly pain. Follow these instructions at home:  Take over-the-counter and prescription medicines only as told by your doctor. Do not take medicines that help you poop (laxatives) unless told to by your doctor.  Drink enough fluid to keep your pee (urine) clear or pale yellow.  Watch your belly pain for any changes.  Keep all follow-up visits as told by your doctor. This is important. Contact a doctor if:  Your belly pain changes or gets worse.  You are not hungry, or you lose weight without trying.  You are having trouble pooping (constipated) or have watery poop (diarrhea) for more than 2-3 days.  You have pain when you pee or poop.  Your belly pain wakes you up at night.  Your pain gets worse with meals, after eating, or with certain foods.  You are throwing up and cannot keep anything down.  You have a fever. Get help right away if:  Your pain does not go away as soon as your doctor says it should.  You cannot stop throwing up.  Your pain is only in areas of your belly, such as the right side or the left lower part of the belly.  You have bloody or black poop, or poop that looks like tar.  You have very bad pain, cramping, or bloating in your belly.  You have signs of not having enough fluid or water in your body (dehydration), such as: ? Dark pee, very little pee, or no pee. ? Cracked lips. ? Dry mouth. ? Sunken eyes. ? Sleepiness. ? Weakness. This information is not intended to replace advice given to you by your health care provider. Make sure you discuss any questions you have with your health care provider. Document Released: 03/03/2008 Document Revised: 04/04/2016 Document Reviewed: 02/27/2016 Elsevier  Interactive Patient Education  2018 [employer].  Dyslipidemia Dyslipidemia is an imbalance of waxy, fat-like substances (lipids) in the blood. The body needs lipids in small amounts. Dyslipidemia often involves a high level of cholesterol or triglycerides, which are types of lipids. Common forms of dyslipidemia include:  High levels of bad cholesterol (LDL cholesterol). LDL is the type of cholesterol that causes fatty deposits (plaques) to build up in the blood vessels that carry blood away from your heart (arteries).  Low levels of good cholesterol (HDL cholesterol). HDL cholesterol is the type of cholesterol that protects against heart disease. High levels of HDL remove the LDL buildup from arteries.  High levels of triglycerides. Triglycerides are a fatty substance in the blood that is linked to a buildup of plaques in the arteries.  You can develop dyslipidemia because of the genes you are born with (primary dyslipidemia) or changes that occur during your life (secondary dyslipidemia), or as a side effect of certain medical treatments. What are the causes? Primary dyslipidemia is caused by changes (mutations) in genes that are passed down through families (inherited). These mutations cause several types of dyslipidemia. Mutations can result in disorders that make the body produce too much LDL cholesterol or triglycerides, or not enough HDL cholesterol. These disorders may lead to heart disease, arterial disease, or stroke at an early age. Causes of secondary dyslipidemia include certain lifestyle choices and diseases that lead to dyslipidemia, such as:  Eating a diet that is high in animal fat.  Not getting enough activity or exercise (having a sedentary lifestyle).  Having diabetes, kidney disease, liver disease, or thyroid disease.  Drinking large amounts of alcohol.  Using certain types of drugs.  What increases the risk? You may be at greater risk for dyslipidemia if you are an  older man or if you are a woman who has gone through menopause. Other risk factors include:  Having a family history of dyslipidemia.  Taking certain medicines, including birth control pills, steroids, some diuretics, beta-blockers, and some medicines forHIV.  Smoking cigarettes.  Eating a high-fat diet.  Drinking large amounts of alcohol.  Having certain medical conditions such as diabetes, polycystic ovary syndrome (PCOS), pregnancy, kidney disease, liver disease, or hypothyroidism.  Not exercising regularly.  Being overweight or obese with too much belly fat.  What are the signs or symptoms? Dyslipidemia does not usually cause any symptoms. Very high lipid levels can cause fatty bumps under the skin (xanthomas) or a white or gray ring around the black center (pupil) of the eye. Very high triglyceride levels can cause inflammation of the pancreas (pancreatitis). How is this diagnosed? Your health care provider may diagnose dyslipidemia based on a routine blood test (fasting blood test). Because most people do not have symptoms of the condition, this blood testing (lipid profile) is done on adults age 57 and older and is repeated every 5 years. This test checks:  Total cholesterol. This is a measure of the total amount of cholesterol in your blood, including LDL cholesterol, HDL cholesterol, and triglycerides. A healthy number is below 200.  LDL cholesterol. The target number for LDL cholesterol is different for each person, depending on individual risk factors. For most people, a number below 100 is healthy. Ask your health care provider what your LDL cholesterol number should be.  HDL cholesterol. An HDL level of 60 or higher is best because it helps to protect against heart disease. A number below 78 for men or below 85 for women increases the risk for heart disease.  Triglycerides. A healthy triglyceride number is below 150.  If your lipid profile is abnormal, your health care  provider may do other blood tests to get more information about your condition. How is this treated? Treatment depends on the type of dyslipidemia that you have and your other risk factors for heart disease and stroke. Your health care provider will have a target range for your lipid levels based on this information. For many people, treatment starts with lifestyle changes, such as diet and exercise. Your health care provider may recommend that you:  Get regular exercise.  Make changes to your diet.  Quit smoking if you smoke.  If diet changes and exercise do not help you reach your goals, your health care provider may also prescribe medicine to lower lipids. The most commonly prescribed type of medicine lowers your LDL cholesterol (statin drug). If you have a high triglyceride level, your provider may prescribe another type of drug (fibrate) or an omega-3 fish oil supplement, or both. Follow these instructions at home:  Take over-the-counter and prescription medicines only as told by your health care provider. This includes supplements.  Get regular exercise. Start an aerobic exercise and strength training program as told by your health care provider. Ask your health care provider what activities are safe for you. Your health care provider may recommend: ? 30 minutes of aerobic activity 4-6 days a week. Brisk walking is an  example of aerobic activity. ? Strength training 2 days a week.  Eat a healthy diet as told by your health care provider. This can help you reach and maintain a healthy weight, lower your LDL cholesterol, and raise your HDL cholesterol. It may help to work with a diet and nutrition specialist (dietitian) to make a plan that is right for you. Your dietitian or health care provider may recommend: ? Limiting your calories, if you are overweight. ? Eating more fruits, vegetables, whole grains, fish, and lean meats. ? Limiting saturated fat, trans fat, and cholesterol.  Follow  instructions from your health care provider or dietitian about eating or drinking restrictions.  Limit alcohol intake to no more than one drink per day for nonpregnant women and two drinks per day for men. One drink equals 12 oz of beer, 5 oz of wine, or 1 oz of hard liquor.  Do not use any products that contain nicotine or tobacco, such as cigarettes and e-cigarettes. If you need help quitting, ask your health care provider.  Keep all follow-up visits as told by your health care provider. This is important. Contact a health care provider if:  You are having trouble sticking to your exercise or diet plan.  You are struggling to quit smoking or control your use of alcohol. Summary  Dyslipidemia is an imbalance of waxy, fat-like substances (lipids) in the blood. The body needs lipids in small amounts. Dyslipidemia often involves a high level of cholesterol or triglycerides, which are types of lipids.  Treatment depends on the type of dyslipidemia that you have and your other risk factors for heart disease and stroke.  For many people, treatment starts with lifestyle changes, such as diet and exercise. Your health care provider may also prescribe medicine to lower lipids. This information is not intended to replace advice given to you by your health care provider. Make sure you discuss any questions you have with your health care provider. Document Released: 09/20/2013 Document Revised: 05/12/2016 Document Reviewed: 05/12/2016 Elsevier Interactive Patient Education  [employer].

## 2018-09-09 NOTE — Progress Notes (Signed)
Subjective:    Patient ID: Leonard Rojas, male    DOB: May 23, 1966, 52 y.o.   MRN: [id number]  No chief complaint on file.   HPI Patient was seen today for follow up and TOC, previously seen by Dr. Sherren Mocha.  Abdominal pain: -x months -dull discomfort in RUQ and LUQ -no a/w food.  Denies n/v -endorses some issues with constipation, uses colace prn.  Drinking <1 bottle of water/day.   -had colonoscopy 12/2017 -pt notes being worried about his health after a friend was dx'd with cancer   HTN: -pt is no longer taking lisinopril 5 mg -has been off med x 1 yr -endorses loosing weight on Keto diet -bp at home [id number]  DM: -no longer taking metformin -not checking fsbs -was on lisinopril 5 mg for renal protection, no longer taking.  H/o HLD: -not taking any meds   Past Medical History:  Diagnosis Date  . Diabetes mellitus type II    controlled with diet  . FHx: allergies    tb skin test  . Hyperlipidemia   . Hypertension   . Obese   . Sleep apnea    borderline-does use CPAP occ.     No Known Allergies  ROS General: Denies fever, chills, night sweats, changes in weight, changes in appetite HEENT: Denies headaches, ear pain, changes in vision, rhinorrhea, sore throat CV: Denies CP, palpitations, SOB, orthopnea Pulm: Denies SOB, cough, wheezing GI: Denies nausea, vomiting, diarrhea +abdominal pain, some constipation GU: Denies dysuria, hematuria, frequency, vaginal discharge Msk: Denies muscle cramps, joint pains Neuro: Denies weakness, numbness, tingling Skin: Denies rashes, bruising Psych: Denies depression, anxiety, hallucinations      Objective:    Blood pressure 112/80, pulse 78, temperature 98.4 F (36.9 C), temperature source Oral, weight 283 lb (128.4 kg), SpO2 97 %.  Gen. Pleasant, well-nourished, in no distress, obese, normal affect   HEENT: [place]/AT, face symmetric, no scleral icterus, PERRLA, nares patent without drainage Lungs: no accessory muscle  use, CTAB, no wheezes or rales Cardiovascular: RRR, no m/r/g, no peripheral edema Abdomen: BS present, soft, NT/ND, no hepatosplenomegaly. Neuro:  A&Ox3, CN II-XII intact, normal gait  Wt Readings from Last 3 Encounters:  09/09/18 283 lb (128.4 kg)  02/05/18 276 lb (125.2 kg)  01/13/18 276 lb 9.6 oz (125.5 kg)    Lab Results  Component Value Date   WBC 4.5 11/04/2017   HGB 13.6 11/04/2017   HCT 41.6 11/04/2017   PLT 232.0 11/04/2017   GLUCOSE 107 (H) 11/04/2017   CHOL 259 (H) 11/04/2017   TRIG 105.0 11/04/2017   HDL 32.30 (L) 11/04/2017   LDLDIRECT 150.0 11/03/2013   LDLCALC 206 (H) 11/04/2017   ALT 23 11/04/2017   AST 20 11/04/2017   NA 140 11/04/2017   K 4.1 11/04/2017   CL 103 11/04/2017   CREATININE 1.05 11/04/2017   BUN 19 11/04/2017   CO2 30 11/04/2017   TSH 3.43 11/04/2017   PSA 0.73 11/04/2017   HGBA1C 6.2 01/12/2018   MICROALBUR <0.7 11/04/2017    Assessment/Plan:  Pain of upper abdomen  -difficulty describing pain -discussed possible causes including constipation, gastritis, gallbladder issues, etc. -will obtain labs -consider imaging and possible f/u with GI based on labs. - Plan: CBC with Differential/Platelet, Comprehensive metabolic panel, Hemoglobin A1c, Lipase  Mixed hyperlipidemia  - Plan: Lipid panel  Essential hypertension -controlled -discussed lifestyle modifications -continue checking bp at home  Diabetes 1.5, managed as type 2 ([place])  -will obtain Hgb A1C -if needed will  restart metformin  -discussed lifestyle modifications  F/u prn.  Pt should schedule CPE  Grier Mitts, DM

## 2018-11-09 DIAGNOSIS — K080 Exfoliation of teeth due to systemic causes: Secondary | ICD-10-CM | POA: Diagnosis not present

## 2019-10-18 DIAGNOSIS — Z23 Encounter for immunization: Secondary | ICD-10-CM | POA: Diagnosis not present

## 2019-11-08 DIAGNOSIS — Z23 Encounter for immunization: Secondary | ICD-10-CM | POA: Diagnosis not present

## 2019-12-21 DIAGNOSIS — E119 Type 2 diabetes mellitus without complications: Secondary | ICD-10-CM | POA: Diagnosis not present

## 2020-02-16 ENCOUNTER — Ambulatory Visit (INDEPENDENT_AMBULATORY_CARE_PROVIDER_SITE_OTHER): Payer: Federal, State, Local not specified - PPO | Admitting: Family Medicine

## 2020-02-16 ENCOUNTER — Encounter: Payer: Self-pay | Admitting: Family Medicine

## 2020-02-16 ENCOUNTER — Other Ambulatory Visit: Payer: Self-pay

## 2020-02-16 VITALS — BP 128/88 | HR 78 | Temp 97.8°F | Wt 298.0 lb

## 2020-02-16 DIAGNOSIS — R1010 Upper abdominal pain, unspecified: Secondary | ICD-10-CM

## 2020-02-16 DIAGNOSIS — R079 Chest pain, unspecified: Secondary | ICD-10-CM

## 2020-02-16 DIAGNOSIS — E139 Other specified diabetes mellitus without complications: Secondary | ICD-10-CM | POA: Diagnosis not present

## 2020-02-16 DIAGNOSIS — I10 Essential (primary) hypertension: Secondary | ICD-10-CM

## 2020-02-16 DIAGNOSIS — Z0000 Encounter for general adult medical examination without abnormal findings: Secondary | ICD-10-CM

## 2020-02-16 DIAGNOSIS — K5900 Constipation, unspecified: Secondary | ICD-10-CM

## 2020-02-16 LAB — COMPREHENSIVE METABOLIC PANEL
ALT: 16 U/L (ref 0–53)
AST: 12 U/L (ref 0–37)
Albumin: 4.3 g/dL (ref 3.5–5.2)
Alkaline Phosphatase: 82 U/L (ref 39–117)
BUN: 13 mg/dL (ref 6–23)
CO2: 30 mEq/L (ref 19–32)
Calcium: 9.1 mg/dL (ref 8.4–10.5)
Chloride: 99 mEq/L (ref 96–112)
Creatinine, Ser: 0.97 mg/dL (ref 0.40–1.50)
GFR: 97.43 mL/min (ref >60.00)
Glucose, Bld: 216 mg/dL — ABNORMAL HIGH (ref 70–99)
Potassium: 4.5 mEq/L (ref 3.5–5.1)
Sodium: 136 mEq/L (ref 135–145)
Total Bilirubin: 0.3 mg/dL (ref 0.2–1.2)
Total Protein: 7.1 g/dL (ref 6.0–8.3)

## 2020-02-16 LAB — CBC WITH DIFFERENTIAL/PLATELET
Basophils Absolute: 0 10*3/uL (ref 0.0–0.1)
Basophils Relative: 0.4 % (ref 0.0–3.0)
Eosinophils Absolute: 0.1 10*3/uL (ref 0.0–0.7)
Eosinophils Relative: 2.5 % (ref 0.0–5.0)
HCT: 40.4 % (ref 39.0–52.0)
Hemoglobin: 13.2 g/dL (ref 13.0–17.0)
Lymphocytes Relative: 26.9 % (ref 12.0–46.0)
Lymphs Abs: 1.5 10*3/uL (ref 0.7–4.0)
MCHC: 32.7 g/dL (ref 30.0–36.0)
MCV: 88.7 fl (ref 78.0–100.0)
Monocytes Absolute: 0.4 10*3/uL (ref 0.1–1.0)
Monocytes Relative: 6.9 % (ref 3.0–12.0)
Neutro Abs: 3.5 10*3/uL (ref 1.4–7.7)
Neutrophils Relative %: 63.3 % (ref 43.0–77.0)
Platelets: 257 10*3/uL (ref 150.0–400.0)
RBC: 4.55 Mil/uL (ref 4.22–5.81)
RDW: 14.9 % (ref 11.5–15.5)
WBC: 5.6 10*3/uL (ref 4.0–10.5)

## 2020-02-16 LAB — LIPID PANEL
Cholesterol: 223 mg/dL — ABNORMAL HIGH (ref 0–200)
HDL: 44.3 mg/dL (ref >39.00)
LDL Cholesterol: 160 mg/dL — ABNORMAL HIGH (ref 0–99)
NonHDL: 178.69
Total CHOL/HDL Ratio: 5
Triglycerides: 92 mg/dL (ref 0.0–149.0)
VLDL: 18.4 mg/dL (ref 0.0–40.0)

## 2020-02-16 LAB — TSH: TSH: 2.41 u[IU]/mL (ref 0.35–4.50)

## 2020-02-16 LAB — T4, FREE: Free T4: 0.82 ng/dL (ref 0.60–1.60)

## 2020-02-16 LAB — MICROALBUMIN / CREATININE URINE RATIO
Creatinine,U: 202 mg/dL
Microalb Creat Ratio: 0.3 mg/g (ref 0.0–30.0)
Microalb, Ur: <0.7 mg/dL (ref 0.0–1.9)

## 2020-02-16 LAB — HEMOGLOBIN A1C: Hgb A1c MFr Bld: 9.3 % — ABNORMAL HIGH (ref 4.6–6.5)

## 2020-02-16 MED ORDER — METFORMIN HCL 1000 MG PO TABS: 1000.0000 mg | ORAL_TABLET | Freq: Every day | ORAL | 2 refills | Status: DC

## 2020-02-16 MED ORDER — LISINOPRIL 5 MG PO TABS: 5.0000 mg | ORAL_TABLET | Freq: Every day | ORAL | 3 refills | Status: DC

## 2020-02-16 NOTE — Patient Instructions (Addendum)
Preventive Care 41-55 Years Old, Male Preventive care refers to lifestyle choices and visits with your health care provider that can promote health and wellness. This includes:  A yearly physical exam. This is also called an annual well check.  Regular dental and eye exams.  Immunizations.  Screening for certain conditions.  Healthy lifestyle choices, such as eating a healthy diet, getting regular exercise, not using drugs or products that contain nicotine and tobacco, and limiting alcohol use. What can I expect for my preventive care visit? Physical exam Your health care provider will check:  Height and weight. These may be used to calculate body mass index (BMI), which is a measurement that tells if you are at a healthy weight.  Heart rate and blood pressure.  Your skin for abnormal spots. Counseling Your health care provider may ask you questions about:  Alcohol, tobacco, and drug use.  Emotional well-being.  Home and relationship well-being.  Sexual activity.  Eating habits.  Work and work Statistician. What immunizations do I need?  Influenza (flu) vaccine  This is recommended every year. Tetanus, diphtheria, and pertussis (Tdap) vaccine  You may need a Td booster every 10 years. Varicella (chickenpox) vaccine  You may need this vaccine if you have not already been vaccinated. Zoster (shingles) vaccine  You may need this after age 64. Measles, mumps, and rubella (MMR) vaccine  You may need at least one dose of MMR if you were born in 1958 or later. You may also need a second dose. Pneumococcal conjugate (PCV13) vaccine  You may need this if you have certain conditions and were not previously vaccinated. Pneumococcal polysaccharide (PPSV23) vaccine  You may need one or two doses if you smoke cigarettes or if you have certain conditions. Meningococcal conjugate (MenACWY) vaccine  You may need this if you have certain conditions. Hepatitis A  vaccine  You may need this if you have certain conditions or if you travel or work in places where you may be exposed to hepatitis A. Hepatitis B vaccine  You may need this if you have certain conditions or if you travel or work in places where you may be exposed to hepatitis B. Haemophilus influenzae type b (Hib) vaccine  You may need this if you have certain risk factors. Human papillomavirus (HPV) vaccine  If recommended by your health care provider, you may need three doses over 6 months. You may receive vaccines as individual doses or as more than one vaccine together in one shot (combination vaccines). Talk with your health care provider about the risks and benefits of combination vaccines. What tests do I need? Blood tests  Lipid and cholesterol levels. These may be checked every 5 years, or more frequently if you are over 60 years old.  Hepatitis C test.  Hepatitis B test. Screening  Lung cancer screening. You may have this screening every year starting at age 43 if you have a 30-pack-year history of smoking and currently smoke or have quit within the past 15 years.  Prostate cancer screening. Recommendations will vary depending on your family history and other risks.  Colorectal cancer screening. All adults should have this screening starting at age 72 and continuing until age 2. Your health care provider may recommend screening at age 14 if you are at increased risk. You will have tests every 1-10 years, depending on your results and the type of screening test.  Diabetes screening. This is done by checking your blood sugar (glucose) after you have not eaten  for a while (fasting). You may have this done every 1-3 years.  Sexually transmitted disease (STD) testing. Follow these instructions at home: Eating and drinking  Eat a diet that includes fresh fruits and vegetables, whole grains, lean protein, and low-fat dairy products.  Take vitamin and mineral supplements as  recommended by your health care provider.  Do not drink alcohol if your health care provider tells you not to drink.  If you drink alcohol: ? Limit how much you have to 0-2 drinks a day. ? Be aware of how much alcohol is in your drink. In the [place] one drink equals one 12 oz bottle of beer (355 mL), one 5 oz glass of wine (148 mL), or one 1 oz glass of hard liquor (44 mL). Lifestyle  Take daily care of your teeth and gums.  Stay active. Exercise for at least 30 minutes on 5 or more days each week.  Do not use any products that contain nicotine or tobacco, such as cigarettes, e-cigarettes, and chewing tobacco. If you need help quitting, ask your health care provider.  If you are sexually active, practice safe sex. Use a condom or other form of protection to prevent STIs (sexually transmitted infections).  Talk with your health care provider about taking a low-dose aspirin every day starting at age 61. What's next?  Go to your health care provider once a year for a well check visit.  Ask your health care provider how often you should have your eyes and teeth checked.  Stay up to date on all vaccines. This information is not intended to replace advice given to you by your health care provider. Make sure you discuss any questions you have with your health care provider. Document Revised: 09/09/2018 Document Reviewed: 09/09/2018 Elsevier Patient Education  2020 [employer].  Managing Your Hypertension Hypertension is commonly called high blood pressure. This is when the force of your blood pressing against the walls of your arteries is too strong. Arteries are blood vessels that carry blood from your heart throughout your body. Hypertension forces the heart to work harder to pump blood, and may cause the arteries to become narrow or stiff. Having untreated or uncontrolled hypertension can cause heart attack, stroke, kidney disease, and other problems. What are blood pressure readings? A  blood pressure reading consists of a higher number over a lower number. Ideally, your blood pressure should be below 120/80. The first ("top") number is called the systolic pressure. It is a measure of the pressure in your arteries as your heart beats. The second ("bottom") number is called the diastolic pressure. It is a measure of the pressure in your arteries as the heart relaxes. What does my blood pressure reading mean? Blood pressure is classified into four stages. Based on your blood pressure reading, your health care provider may use the following stages to determine what type of treatment you need, if any. Systolic pressure and diastolic pressure are measured in a unit called mm Hg. Normal  Systolic pressure: below 762.  Diastolic pressure: below 80. Elevated  Systolic pressure: 831-517.  Diastolic pressure: below 80. Hypertension stage 1  Systolic pressure: 616-073.  Diastolic pressure: 71-06. Hypertension stage 2  Systolic pressure: 269 or above.  Diastolic pressure: 90 or above. What health risks are associated with hypertension? Managing your hypertension is an important responsibility. Uncontrolled hypertension can lead to:  A heart attack.  A stroke.  A weakened blood vessel (aneurysm).  Heart failure.  Kidney damage.  Eye damage.  Metabolic syndrome.  Memory and concentration problems. What changes can I make to manage my hypertension? Hypertension can be managed by making lifestyle changes and possibly by taking medicines. Your health care provider will help you make a plan to bring your blood pressure within a normal range. Eating and drinking   Eat a diet that is high in fiber and potassium, and low in salt (sodium), added sugar, and fat. An example eating plan is called the DASH (Dietary Approaches to Stop Hypertension) diet. To eat this way: ? Eat plenty of fresh fruits and vegetables. Try to fill half of your plate at each meal with fruits and  vegetables. ? Eat whole grains, such as whole wheat pasta, brown rice, or whole grain bread. Fill about one quarter of your plate with whole grains. ? Eat low-fat diary products. ? Avoid fatty cuts of meat, processed or cured meats, and poultry with skin. Fill about one quarter of your plate with lean proteins such as fish, chicken without skin, beans, eggs, and tofu. ? Avoid premade and processed foods. These tend to be higher in sodium, added sugar, and fat.  Reduce your daily sodium intake. Most people with hypertension should eat less than 1,500 mg of sodium a day.  Limit alcohol intake to no more than 1 drink a day for nonpregnant women and 2 drinks a day for men. One drink equals 12 oz of beer, 5 oz of wine, or 1 oz of hard liquor. Lifestyle  Work with your health care provider to maintain a healthy body weight, or to lose weight. Ask what an ideal weight is for you.  Get at least 30 minutes of exercise that causes your heart to beat faster (aerobic exercise) most days of the week. Activities may include walking, swimming, or biking.  Include exercise to strengthen your muscles (resistance exercise), such as weight lifting, as part of your weekly exercise routine. Try to do these types of exercises for 30 minutes at least 3 days a week.  Do not use any products that contain nicotine or tobacco, such as cigarettes and e-cigarettes. If you need help quitting, ask your health care provider.  Control any long-term (chronic) conditions you have, such as high cholesterol or diabetes. Monitoring  Monitor your blood pressure at home as told by your health care provider. Your personal target blood pressure may vary depending on your medical conditions, your age, and other factors.  Have your blood pressure checked regularly, as often as told by your health care provider. Working with your health care provider  Review all the medicines you take with your health care provider because there may  be side effects or interactions.  Talk with your health care provider about your diet, exercise habits, and other lifestyle factors that may be contributing to hypertension.  Visit your health care provider regularly. Your health care provider can help you create and adjust your plan for managing hypertension. Will I need medicine to control my blood pressure? Your health care provider may prescribe medicine if lifestyle changes are not enough to get your blood pressure under control, and if:  Your systolic blood pressure is 130 or higher.  Your diastolic blood pressure is 80 or higher. Take medicines only as told by your health care provider. Follow the directions carefully. Blood pressure medicines must be taken as prescribed. The medicine does not work as well when you skip doses. Skipping doses also puts you at risk for problems. Contact a health care provider if:  You think you are having a reaction to medicines you have taken.  You have repeated (recurrent) headaches.  You feel dizzy.  You have swelling in your ankles.  You have trouble with your vision. Get help right away if:  You develop a severe headache or confusion.  You have unusual weakness or numbness, or you feel faint.  You have severe pain in your chest or abdomen.  You vomit repeatedly.  You have trouble breathing. Summary  Hypertension is when the force of blood pumping through your arteries is too strong. If this condition is not controlled, it may put you at risk for serious complications.  Your personal target blood pressure may vary depending on your medical conditions, your age, and other factors. For most people, a normal blood pressure is less than 120/80.  Hypertension is managed by lifestyle changes, medicines, or both. Lifestyle changes include weight loss, eating a healthy, low-sodium diet, exercising more, and limiting alcohol. This information is not intended to replace advice given to you by  your health care provider. Make sure you discuss any questions you have with your health care provider. Document Revised: 01/07/2019 Document Reviewed: 08/13/2016 Elsevier Patient Education  2020 [employer], Adult Stress is a normal reaction to life events. Stress is what you feel when life demands more than you are used to, or more than you think you can handle. Some stress can be useful, such as studying for a test or meeting a deadline at work. Stress that occurs too often or for too long can cause problems. It can affect your emotional health and interfere with relationships and normal daily activities. Too much stress can weaken your body's defense system (immune system) and increase your risk for physical illness. If you already have a medical problem, stress can make it worse. What are the causes? All sorts of life events can cause stress. An event that causes stress for one person may not be stressful for another person. Major life events, whether positive or negative, commonly cause stress. Examples include:  Losing a job or starting a new job.  Losing a loved one.  Moving to a new town or home.  Getting married or divorced.  Having a baby.  Getting injured or sick. Less obvious life events can also cause stress, especially if they occur day after day or in combination with each other. Examples include:  Working long hours.  Driving in traffic.  Caring for children.  Being in debt.  Being in a difficult relationship. What are the signs or symptoms? Stress can cause emotional symptoms, including:  Anxiety. This is feeling worried, afraid, on edge, overwhelmed, or out of control.  Anger, including irritation or impatience.  Depression. This is feeling sad, down, helpless, or guilty.  Trouble focusing, remembering, or making decisions. Stress can cause physical symptoms, including:  Aches and pains. These may affect your head, neck, back, stomach, or other  areas of your body.  Tight muscles or a clenched jaw.  Low energy.  Trouble sleeping. Stress can cause unhealthy behaviors, including:  Eating to feel better (overeating) or skipping meals.  Working too much or putting off tasks.  Smoking, drinking alcohol, or using drugs to feel better. How is this diagnosed? Stress is diagnosed through an assessment by your health care provider. He or she may diagnose this condition based on:  Your symptoms and any stressful life events.  Your medical history.  Tests to rule out other causes of your symptoms. Depending  on your condition, your health care provider may refer you to a specialist for further evaluation. How is this treated?  Stress management techniques are the recommended treatment for stress. Medicine is not typically recommended for the treatment of stress. Techniques to reduce your reaction to stressful life events include:  Stress identification. Monitor yourself for symptoms of stress and identify what causes stress for you. These skills may help you to avoid or prepare for stressful events.  Time management. Set your priorities, keep a calendar of events, and learn to say no. Taking these actions can help you avoid making too many commitments. Techniques for coping with stress include:  Rethinking the problem. Try to think realistically about stressful events rather than ignoring them or overreacting. Try to find the positives in a stressful situation rather than focusing on the negatives.  Exercise. Physical exercise can release both physical and emotional tension. The key is to find a form of exercise that you enjoy and do it regularly.  Relaxation techniques. These relax the body and mind. The key is to find one or more that you enjoy and use the techniques regularly. Examples include: ? Meditation, deep breathing, or progressive relaxation techniques. ? Yoga or tai chi. ? Biofeedback, mindfulness techniques, or  journaling. ? Listening to music, being out in nature, or participating in other hobbies.  Practicing a healthy lifestyle. Eat a balanced diet, drink plenty of water, limit or avoid caffeine, and get plenty of sleep.  Having a strong support network. Spend time with family, friends, or other people you enjoy being around. Express your feelings and talk things over with someone you trust. Counseling or talk therapy with a mental health professional may be helpful if you are having trouble managing stress on your own. Follow these instructions at home: Lifestyle   Avoid drugs.  Do not use any products that contain nicotine or tobacco, such as cigarettes, e-cigarettes, and chewing tobacco. If you need help quitting, ask your health care provider.  Limit alcohol intake to no more than 1 drink a day for nonpregnant women and 2 drinks a day for men. One drink equals 12 oz of beer, 5 oz of wine, or 1 oz of hard liquor  Do not use alcohol or drugs to relax.  Eat a balanced diet that includes fresh fruits and vegetables, whole grains, lean meats, fish, eggs, and beans, and low-fat dairy. Avoid processed foods and foods high in added fat, sugar, and salt.  Exercise at least 30 minutes on 5 or more days each week.  Get 7-8 hours of sleep each night. General instructions   Practice stress management techniques as discussed with your health care provider.  Drink enough fluid to keep your urine clear or pale yellow.  Take over-the-counter and prescription medicines only as told by your health care provider.  Keep all follow-up visits as told by your health care provider. This is important. Contact a health care provider if:  Your symptoms get worse.  You have new symptoms.  You feel overwhelmed by your problems and can no longer manage them on your own. Get help right away if:  You have thoughts of hurting yourself or others. If you ever feel like you may hurt yourself or others, or  have thoughts about taking your own life, get help right away. You can go to your nearest emergency department or call:  Your local emergency services (911 in the [place]  A suicide crisis helpline, such as the National Suicide Prevention  Lifeline at 9853734398. This is open 24 hours a day. Summary  Stress is a normal reaction to life events. It can cause problems if it happens too often or for too long.  Practicing stress management techniques is the best way to treat stress.  Counseling or talk therapy with a mental health professional may be helpful if you are having trouble managing stress on your own. This information is not intended to replace advice given to you by your health care provider. Make sure you discuss any questions you have with your health care provider. Document Revised: 04/15/2019 Document Reviewed: 11/05/2016 Elsevier Patient Education  [employer].  Chronic Constipation  Chronic constipation is a condition in which a person has three or fewer bowel movements a week, for three months or longer. This condition is especially common in older adults. The two main kinds of chronic constipation are secondary constipation and functional constipation. Secondary constipation results from another condition or a treatment. Functional constipation, also called primary or idiopathic constipation, is divided into three types:  Normal transit constipation. In this type, movement of stool through the colon (stool transit) occurs normally.  Slow transit constipation. In this type, stool moves slowly through the colon.  Outlet constipation or pelvic floor dysfunction. In this type, the nerves and muscles that empty the rectum do not work normally. What are the causes? Causes of secondary constipation may include:  Failing to drink enough fluid, eat enough food or fiber, or get physically active.  Pregnancy.  A tear in the anus (anal fissure).  Blockage in the bowel  (bowel obstruction).  Narrowing of the bowel (bowel stricture).  Having a long-term medical condition, such as: ? Diabetes. ? Hypothyroidism. ? Multiple sclerosis. ? Parkinson disease. ? Stroke. ? Spinal cord injury. ? Dementia. ? Colon cancer. ? Inflammatory bowel disease (IBD). ? Iron-deficiency anemia. ? Outward collapse of the rectum (rectal prolapse). ? Hemorrhoids.  Taking certain medicines, including: ? Narcotics. These are a certain type of prescription pain medicine. ? Antacids. ? Iron supplements. ? Water pills (diuretics). ? Certain blood pressure medicines. ? Anti-seizure medicines. ? Antidepressants. ? Medicines for Parkinson disease. The cause of functional constipation is not known, but some conditions are associated with it. These conditions include:  Stress.  Problems in the nerves and muscles that control stool transit.  Weak or impaired pelvic floor muscles. What increases the risk? You may be at higher risk for chronic constipation if you:  Are older than age 60.  Are male.  Live in a long-term care facility.  Do not get much exercise or physical activity (have a sedentary lifestyle).  Do not drink enough fluids.  Do not eat enough food, especially fiber.  Have a long-term disease.  Have a mental health disorder or eating disorder.  Take many medicines. What are the signs or symptoms? The main symptom of chronic constipation is having three or fewer bowel movements a week for several weeks. Other signs and symptoms may vary from person to person. These include:  Pushing hard (straining) to pass stool.  Painful bowel movements.  Having hard or lumpy stools.  Having lower belly discomfort, such as cramps or bloating.  Being unable to have a bowel movement when you feel the urge.  Feeling like you still need to pass stool after a bowel movement.  Feeling that you have something in your rectum that is blocking or preventing bowel  movements.  Seeing blood on the toilet paper or in your stool.  Worsening confusion (in older adults). How is this diagnosed? This condition may be diagnosed based on:  Symptoms and medical history. You will be asked about your symptoms, lifestyle, diet, and any medicines that you are taking.  Physical exam. ? Your belly (abdomen) will be examined. ? A digital rectal exam may be done. For this exam, a health care provider places a lubricated, gloved finger into the rectum.  Other tests to check for any underlying causes of your constipation. These may be ordered if you have bleeding in your rectum, weight loss, or a family history of colon cancer. In these cases, you may have: ? Imaging studies of the colon. These may include X-ray, ultrasound, or CT scan. ? Blood tests. ? A procedure to examine the inside of your colon (colonoscopy). ? More specialized tests to check:  Whether your anal sphincter works well. This is a ring-shaped muscle that controls the closing of the anus.  How well food moves through your colon. ? Tests to measure the nerve signal in your pelvic floor muscles (electromyography). How is this treated? Treatment for chronic constipation depends on the cause. Most often, treatment starts with:  Being more active and getting regular exercise.  Drinking more fluids.  Adding fiber to your diet. Sources of fiber include fruits, vegetables, whole grains, and fiber supplements.  Using medicines such as stool softeners or medicines that increase contractions in your digestive system (pro-motility agents).  Training your pelvic muscles with biofeedback.  Surgery, if there is obstruction. Treatment for secondary chronic constipation depends on the underlying condition. You may need to:  Stop or change some medicines if they cause constipation.  Use a fiber supplement (bulk laxative) or stool softener.  Use prescription laxative. This works by [employer] into  your colon (osmotic laxative). You may also need to see a specialist who treats conditions of the digestive system (gastroenterologist). Follow these instructions at home:   Take over-the-counter and prescription medicines only as told by your health care provider.  If you are taking a laxative, take it as told by your health care provider.  Eat a balanced diet that includes enough fiber. Ask your health care provider to recommend a diet that is right for you.  Drink clear fluids, especially water. Avoid drinking alcohol, caffeine, and soda.  Drink enough fluid to keep your urine pale yellow.  Get some physical activity every day. Ask your health care provider what physical activities are safe for you.  Get colon cancer screenings as told by your health care provider.  Keep all follow-up visits as told by your health care provider. This is important. Contact a health care provider if:  You are having three or fewer bowel movements a week.  Your stools are hard or lumpy.  You notice blood on the toilet paper or in your stool after you have a bowel movement.  You have unexplained weight loss.  You have rectum (rectal) pain.  You have stool leakage.  You experience nausea or vomiting. Get help right away if:  You have rectal bleeding or you pass blood clots.  You have severe rectal pain.  You have body tissue that pushes out (protrudes) from your anus.  You have severe pain or bloating (distension) in your abdomen.  You have vomiting that you cannot control. Summary  Chronic constipation is a condition in which a person has three or fewer bowel movements a week, for three months or longer.  You may have a higher risk for  this condition if you are an older adult, or if you do not drink enough water or get enough physical activity (are sedentary).  Treatment for this condition depends on the cause. Most treatments for chronic constipation include adding fiber to your  diet, drinking more fluids, and getting more physical activity. You may also need to treat any underlying medical conditions or stop or change certain medicines if they cause constipation.  If lifestyle changes do not relieve constipation, your health care provider may recommend taking a laxative. This information is not intended to replace advice given to you by your health care provider. Make sure you discuss any questions you have with your health care provider. Document Revised: 08/28/2017 Document Reviewed: 06/02/2017 Elsevier Patient Education  [place].  Angina  Angina is extreme discomfort in the chest, neck, arm, jaw, or back. The discomfort is caused by a lack of blood in the middle layer of the heart wall (myocardium). There are four types of angina:  Stable angina. This is triggered by vigorous activity or exercise. It goes away when you rest or take angina medicine.  Unstable angina. This is a warning sign and can lead to a heart attack (acute coronary syndrome). This is a medical emergency. Symptoms come at rest and last a long time.  Microvascular angina. This affects the small coronary arteries. Symptoms include feeling tired and being short of breath.  Prinzmetal or variant angina. This is caused by a tightening (spasm) of the arteries that go to your heart. What are the causes? This condition is caused by atherosclerosis. This is the buildup of fat and cholesterol (plaque) in your arteries. The plaque may narrow or block the artery. Other causes of angina include:  Sudden tightening of the muscles of the arteries in the heart (coronary spasm).  Small artery disease (microvascular dysfunction).  Problems with any of your heart valves (heart valve disease).  A tear in an artery in your heart (coronary artery dissection).  Diseases of the heart muscle (cardiomyopathy), or other heart diseases. What increases the risk? You are more likely to develop this  condition if you have:  High cholesterol.  High blood pressure (hypertension).  Diabetes.  A family history of heart disease.  An inactive (sedentary) lifestyle, or you do not exercise enough.  Depression.  Had radiation treatment to the left side of your chest. Other risk factors include:  Using tobacco.  Being obese.  Eating a diet high in saturated fats.  Being exposed to high stress or triggers of stress.  Using drugs, such as cocaine. Women have a greater risk for angina if:  They are older than 10.  They have gone through menopause (are postmenopausal). What are the signs or symptoms? Common symptoms of this condition in both men and women may include:  Chest pain, which may: ? Feel like a crushing or squeezing in the chest, or like a tightness, pressure, fullness, or heaviness in the chest. ? Last for more than a few minutes at a time, or it may stop and come back (recur) over the course of a few minutes.  Pain in the neck, arm, jaw, or back.  Unexplained heartburn or indigestion.  Shortness of breath.  Nausea.  Sudden cold sweats. Women and people with diabetes may have unusual (atypical) symptoms, such as:  Fatigue.  Unexplained feelings of nervousness or anxiety.  Unexplained weakness.  Dizziness or fainting. How is this diagnosed? This condition may be diagnosed based on:  Your symptoms and  medical history.  Electrocardiogram (ECG) to measure the electrical activity in your heart.  Blood tests.  Stress test to look for signs of blockage when your heart is stressed.  CT angiogram to examine your heart and the blood flow to it.  Coronary angiogram to check your coronary arteries for blockage. How is this treated? Angina may be treated with:  Medicines to: ? Prevent blood clots and heart attack. ? Relax blood vessels and improve blood flow to the heart (nitrates). ? Reduce blood pressure, improve the pumping action of the heart, and  relax blood vessels that are spasming. ? Reduce cholesterol and help treat atherosclerosis.  A procedure to widen a narrowed or blocked coronary artery (angioplasty). A mesh tube may be placed in a coronary artery to keep it open (coronary stenting).  Surgery to allow blood to go around a blocked artery (coronary artery bypass surgery). Follow these instructions at home: Medicines  Take over-the-counter and prescription medicines only as told by your health care provider.  Do not take the following medicines unless your health care provider approves: ? NSAIDs, such as ibuprofen or naproxen. ? Vitamin supplements that contain vitamin A, vitamin E, or both. ? Hormone replacement therapy that contains estrogen with or without progestin. Eating and drinking   Eat a heart-healthy diet. This includes plenty of fresh fruits and vegetables, whole grains, low-fat (lean) protein, and low-fat dairy products.  Follow instructions from your health care provider about eating or drinking restrictions. Activity  Follow an exercise program approved by your health care provider.  Consider joining a cardiac rehabilitation program.  Take a break when you feel fatigued. Plan rest periods in your daily activities. Lifestyle   Do not use any products that contain nicotine or tobacco, such as cigarettes, e-cigarettes, and chewing tobacco. If you need help quitting, ask your health care provider.  If your health care provider says you can drink alcohol: ? Limit how much you use to:  0-1 drink a day for nonpregnant women.  0-2 drinks a day for men. ? Be aware of how much alcohol is in your drink. In the [place] one drink equals one 12 oz bottle of beer (355 mL), one 5 oz glass of wine (148 mL), or one 1 oz glass of hard liquor (44 mL). General instructions  Maintain a healthy weight.  Learn to manage stress.  Keep your vaccinations up to date. Get the flu (influenza) vaccine every year.  Talk to  your health care provider if you feel depressed. Take a depression screening test to see if you are at risk for depression.  Work with your health care provider to manage other health conditions, such as hypertension or diabetes.  Keep all follow-up visits as told by your health care provider. This is important. Get help right away if:  You have pain in your chest, neck, arm, jaw, or back, and the pain: ? Lasts more than a few minutes. ? Is recurring. ? Is not relieved by taking medicines under the tongue (sublingual nitroglycerin). ? Increases in intensity or frequency.  You have a lot of sweating without cause.  You have unexplained: ? Heartburn or indigestion. ? Shortness of breath or difficulty breathing. ? Nausea or vomiting. ? Fatigue. ? Feelings of nervousness or anxiety. ? Weakness.  You have sudden light-headedness or dizziness.  You faint. These symptoms may represent a serious problem that is an emergency. Do not wait to see if the symptoms will go away. Get medical help right  away. Call your local emergency services (911 in the [place] Do not drive yourself to the hospital. Summary  Angina is extreme discomfort in the chest, neck, arm, jaw, or back that is caused by a lack of blood in the heart wall.  There are many symptoms of angina. They include chest pain, unexplained heartburn or indigestion, sudden cold sweats, and fatigue.  Angina may be treated with behavioral changes, medicine, or surgery.  Symptoms of angina may represent an emergency. Get medical help right away. Call your local emergency services (911 in the [place] Do not drive yourself to the hospital. This information is not intended to replace advice given to you by your health care provider. Make sure you discuss any questions you have with your health care provider. Document Revised: 05/03/2018 Document Reviewed: 05/03/2018 Elsevier Patient Education  [place].  Diabetes Basics  Diabetes  (diabetes mellitus) is a long-term (chronic) disease. It occurs when the body does not properly use sugar (glucose) that is released from food after you eat. Diabetes may be caused by one or both of these problems:  Your pancreas does not make enough of a hormone called insulin.  Your body does not react in a normal way to insulin that it makes. Insulin lets sugars (glucose) go into cells in your body. This gives you energy. If you have diabetes, sugars cannot get into cells. This causes high blood sugar (hyperglycemia). Follow these instructions at home: How is diabetes treated? You may need to take insulin or other diabetes medicines daily to keep your blood sugar in balance. Take your diabetes medicines every day as told by your doctor. List your diabetes medicines here: Diabetes medicines  Name of medicine: ______________________________ ? Amount (dose): _______________ Time (a.m./p.m.): _______________ Notes: ___________________________________  Name of medicine: ______________________________ ? Amount (dose): _______________ Time (a.m./p.m.): _______________ Notes: ___________________________________  Name of medicine: ______________________________ ? Amount (dose): _______________ Time (a.m./p.m.): _______________ Notes: ___________________________________ If you use insulin, you will learn how to give yourself insulin by injection. You may need to adjust the amount based on the food that you eat. List the types of insulin you use here: Insulin  Insulin type: ______________________________ ? Amount (dose): _______________ Time (a.m./p.m.): _______________ Notes: ___________________________________  Insulin type: ______________________________ ? Amount (dose): _______________ Time (a.m./p.m.): _______________ Notes: ___________________________________  Insulin type: ______________________________ ? Amount (dose): _______________ Time (a.m./p.m.): _______________ Notes:  ___________________________________  Insulin type: ______________________________ ? Amount (dose): _______________ Time (a.m./p.m.): _______________ Notes: ___________________________________  Insulin type: ______________________________ ? Amount (dose): _______________ Time (a.m./p.m.): _______________ Notes: ___________________________________ How do I manage my blood sugar?  Check your blood sugar levels using a blood glucose monitor as directed by your doctor. Your doctor will set treatment goals for you. Generally, you should have these blood sugar levels:  Before meals (preprandial): 80-130 mg/dL (4.4-7.2 mmol/L).  After meals (postprandial): below 180 mg/dL (10 mmol/L).  A1c level: less than 7%. Write down the times that you will check your blood sugar levels: Blood sugar checks  Time: _______________ Notes: ___________________________________  Time: _______________ Notes: ___________________________________  Time: _______________ Notes: ___________________________________  Time: _______________ Notes: ___________________________________  Time: _______________ Notes: ___________________________________  Time: _______________ Notes: ___________________________________  What do I need to know about low blood sugar? Low blood sugar is called hypoglycemia. This is when blood sugar is at or below 70 mg/dL (3.9 mmol/L). Symptoms may include:  Feeling: ? Hungry. ? Worried or nervous (anxious). ? Sweaty and clammy. ? Confused. ? Dizzy. ? Sleepy. ? Sick to your stomach (nauseous).  Having: ? A fast heartbeat. ? A headache. ? A change in your vision. ? Tingling or no feeling (numbness) around the mouth, lips, or tongue. ? Jerky movements that you cannot control (seizure).  Having trouble with: ? Moving (coordination). ? Sleeping. ? Passing out (fainting). ? Getting upset easily (irritability). Treating low blood sugar To treat low blood sugar, eat or drink  something sugary right away. If you can think clearly and swallow safely, follow the 15:15 rule:  Take 15 grams of a fast-acting carb (carbohydrate). Talk with your doctor about how much you should take.  Some fast-acting carbs are: ? Sugar tablets (glucose pills). Take 3-4 glucose pills. ? 6-8 pieces of hard candy. ? 4-6 oz (120-150 mL) of fruit juice. ? 4-6 oz (120-150 mL) of regular (not diet) soda. ? 1 Tbsp (15 mL) honey or sugar.  Check your blood sugar 15 minutes after you take the carb.  If your blood sugar is still at or below 70 mg/dL (3.9 mmol/L), take 15 grams of a carb again.  If your blood sugar does not go above 70 mg/dL (3.9 mmol/L) after 3 tries, get help right away.  After your blood sugar goes back to normal, eat a meal or a snack within 1 hour. Treating very low blood sugar If your blood sugar is at or below 54 mg/dL (3 mmol/L), you have very low blood sugar (severe hypoglycemia). This is an emergency. Do not wait to see if the symptoms will go away. Get medical help right away. Call your local emergency services (911 in the [place] Do not drive yourself to the hospital. Questions to ask your health care provider  Do I need to meet with a diabetes educator?  What equipment will I need to care for myself at home?  What diabetes medicines do I need? When should I take them?  How often do I need to check my blood sugar?  What number can I call if I have questions?  When is my next doctor's visit?  Where can I find a support group for people with diabetes? Where to find more information  American Diabetes Association: www.diabetes.org  American Association of Diabetes Educators: www.diabeteseducator.org/patient-resources Contact a doctor if:  Your blood sugar is at or above 240 mg/dL (13.3 mmol/L) for 2 days in a row.  You have been sick or have had a fever for 2 days or more, and you are not getting better.  You have any of these problems for more than 6  hours: ? You cannot eat or drink. ? You feel sick to your stomach (nauseous). ? You throw up (vomit). ? You have watery poop (diarrhea). Get help right away if:  Your blood sugar is lower than 54 mg/dL (3 mmol/L).  You get confused.  You have trouble: ? Thinking clearly. ? Breathing. Summary  Diabetes (diabetes mellitus) is a long-term (chronic) disease. It occurs when the body does not properly use sugar (glucose) that is released from food after digestion.  Take insulin and diabetes medicines as told.  Check your blood sugar every day, as often as told.  Keep all follow-up visits as told by your doctor. This is important. This information is not intended to replace advice given to you by your health care provider. Make sure you discuss any questions you have with your health care provider. Document Revised: 06/08/2019 Document Reviewed: 12/18/2017 Elsevier Patient Education  [place].

## 2020-02-17 MED ORDER — METFORMIN HCL 1000 MG PO TABS: 1000.0000 mg | ORAL_TABLET | Freq: Two times a day (BID) | ORAL | 2 refills | Status: DC

## 2020-02-21 ENCOUNTER — Encounter: Payer: Self-pay | Admitting: Family Medicine

## 2020-02-21 NOTE — Progress Notes (Signed)
Subjective:     Leonard Rojas is a 54 y.o. male and is here for a comprehensive physical exam. The patient reports several concerns.  Pt notes increased stress.  Having to take work home and finishing it late at night.  Pt notes son causing increased stress.  At times has CP when dealing with son.  Pt has not been taking lisinopril 5 mg or Metformin 1000 mg as was trying keto diet and needed refills.  In the past was on ASA 81 mg daily but caused epistaxis.  Pt notes a deep cramp that comes and goes in LUQ x yrs.  Endorses chronic constipation.  Drinking 6-7 bottles of water per day.  Tried dulcolax.  Eating fast food daily.  Had Brooks vaccine 09/2019 and 11/08/2019.  Social History   Socioeconomic History  . Marital status: Married    Spouse name: Not on file  . Number of children: Not on file  . Years of education: Not on file  . Highest education level: Not on file  Occupational History  . 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 Smoking status: Never Smoker  . Smokeless tobacco: Never Used  Substance and Sexual Activity  . Alcohol use: No  . Drug use: No  . Sexual activity: Not on file  Other Topics Concern  . Not on file  Social History Narrative  . Not on file   Social Determinants of Health   Financial Resource Strain:   . Difficulty of Paying Living Expenses:   Food Insecurity:   . Worried About Charity fundraiser in the Last Year:   . Arboriculturist in the Last Year:   Transportation Needs:   . Film/video editor (Medical):   Marland Kitchen Lack of Transportation (Non-Medical):   Physical Activity:   . Days of Exercise per Week:   . Minutes of Exercise per Session:   Stress:   . Feeling of Stress :   Social Connections:   . Frequency of Communication with Friends and Family:   . Frequency of Social Gatherings with Friends and Family:   . Attends Religious Services:   . Active Member of Clubs or Organizations:    . Attends Archivist Meetings:   Marland Kitchen Marital Status:   Intimate Partner Violence:   . Fear of Current or Ex-Partner:   . Emotionally Abused:   Marland Kitchen Physically Abused:   . Sexually Abused:    Health Maintenance  Topic Date Due  . PNEUMOCOCCAL POLYSACCHARIDE VACCINE AGE 48-64 HIGH RISK  Never done  . HIV Screening  Never done  . FOOT EXAM  11/27/2015  . OPHTHALMOLOGY EXAM  03/27/2016  . INFLUENZA VACCINE  04/29/2020  . HEMOGLOBIN A1C  08/18/2020  . TETANUS/TDAP  09/30/2022  . COLONOSCOPY  02/06/2023  . COVID-19 Vaccine  Completed    The following portions of the patient's history were reviewed and updated as appropriate: allergies, current medications, past family history, past medical history, past social history, past surgical history and problem list.  Review of Systems Pertinent items noted in HPI and remainder of comprehensive ROS otherwise negative.   Objective:    BP 128/88 (BP Location: Left Arm, Patient Position: Sitting, Cuff Size: Large)   Pulse 78   Temp 97.8 F (36.6 C) (Temporal)   Wt 298 lb (135.2 kg)   SpO2 97%   BMI 41.56 kg/m  General appearance: alert, cooperative and no distress  Head: Normocephalic, without obvious abnormality, atraumatic Eyes: conjunctivae/corneas clear. PERRL, EOM's intact. Fundi benign. Ears: normal TM's and external ear canals both ears Nose: Nares normal. Septum midline. Mucosa normal. No drainage or sinus tenderness., no discharge Throat: lips, mucosa, and tongue normal; teeth and gums normal Neck: no adenopathy, no carotid bruit, no JVD, supple, symmetrical, trachea midline and thyroid not enlarged, symmetric, no tenderness/mass/nodules Lungs: clear to auscultation bilaterally Heart: regular rate and rhythm, S1, S2 normal, no murmur, click, rub or gallop Abdomen: soft, non-tender; bowel sounds normal; no masses,  no organomegaly Extremities: extremities normal, atraumatic, no cyanosis or edema Pulses: 2+ and  symmetric Skin: Skin color, texture, turgor normal. No rashes or lesions Lymph nodes: Cervical, supraclavicular, and axillary nodes normal. Neurologic: Alert and oriented X 3, normal strength and tone. Normal symmetric reflexes. Normal coordination and gait    Assessment:    Healthy male exam with several chronic health conditions and intermittent chest pain.     Plan:     Anticipatory guidance given including wearing seatbelts, smoke detectors in the home, increasing physical activity, increasing p.o. intake of water and vegetables. -will obtain labs -discussed decreasing stress -given handout -next CPE in 1 yr. 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 -will refer to Cardiology for stress test given risk factors and intermittent chest pain. -discussed taking ASA 81 mg a few days/wk as in the past caused epistaxis with daily use. - Plan: CBC with Differential/Platelet, TSH, T4, Free, Lipid panel, EKG 12-Lead, referral to Cardiology  Diabetes 1.5, managed as type 2 ([place])  -Will restart Metformin 1000 mg BID.  Adjust based on labs. -will restart ACEI -discussed lifestyle modifications -pt to schedule eye exam -foot exam at next OFV - Plan: Hemoglobin A1c, Lipid panel, Microalbumin/Creatinine Ratio, Urine, metFORMIN (GLUCOPHAGE) 1000 MG tablet  Pain of upper abdomen  -likely 2/2 constipation - Plan: Comprehensive metabolic panel  Constipation, unspecified constipation type -likely worsened by diet. -discussed increasing fiber and water intake.  Decreasing fast food intake. -Miralax daily.  BID, then decrease dose if needed -consider a probiotic. -given handout -f/u with  GI  Essential hypertension, benign  -controlled -continue lifestyle modifications -monitor bp at home - Plan: lisinopril (ZESTRIL) 5 MG tablet  F/u in 1 month, sooner if needed.  Grier Mitts, MD

## 2020-03-08 ENCOUNTER — Encounter: Payer: Self-pay | Admitting: General Practice

## 2020-03-22 ENCOUNTER — Ambulatory Visit: Payer: Federal, State, Local not specified - PPO | Admitting: Family Medicine

## 2020-05-07 DIAGNOSIS — Z20822 Contact with and (suspected) exposure to covid-19: Secondary | ICD-10-CM | POA: Diagnosis not present

## 2022-04-11 ENCOUNTER — Ambulatory Visit (INDEPENDENT_AMBULATORY_CARE_PROVIDER_SITE_OTHER): Payer: Federal, State, Local not specified - PPO | Admitting: Family Medicine

## 2022-04-11 VITALS — BP 130/86 | HR 79 | Temp 98.5°F | Ht 71.75 in | Wt 299.0 lb

## 2022-04-11 DIAGNOSIS — I10 Essential (primary) hypertension: Secondary | ICD-10-CM | POA: Diagnosis not present

## 2022-04-11 DIAGNOSIS — Z0000 Encounter for general adult medical examination without abnormal findings: Secondary | ICD-10-CM

## 2022-04-11 DIAGNOSIS — E782 Mixed hyperlipidemia: Secondary | ICD-10-CM

## 2022-04-11 DIAGNOSIS — K5900 Constipation, unspecified: Secondary | ICD-10-CM

## 2022-04-11 DIAGNOSIS — R109 Unspecified abdominal pain: Secondary | ICD-10-CM

## 2022-04-11 DIAGNOSIS — R1310 Dysphagia, unspecified: Secondary | ICD-10-CM

## 2022-04-11 DIAGNOSIS — E139 Other specified diabetes mellitus without complications: Secondary | ICD-10-CM

## 2022-04-11 DIAGNOSIS — Z125 Encounter for screening for malignant neoplasm of prostate: Secondary | ICD-10-CM

## 2022-04-11 DIAGNOSIS — R5383 Other fatigue: Secondary | ICD-10-CM

## 2022-04-11 DIAGNOSIS — G4733 Obstructive sleep apnea (adult) (pediatric): Secondary | ICD-10-CM

## 2022-04-11 NOTE — Patient Instructions (Signed)
Referral for you to see the gastroenterologist as well is having a repeat sleep study were placed.  You should expect phone calls about scheduling these appointments.  We will obtain labs.  Based on labs further recommendations will be given as far as restarting medications for diabetes and blood pressure.

## 2022-04-11 NOTE — Progress Notes (Signed)
Subjective:     Leonard Rojas is a 56 y.o. male and is here for a comprehensive physical exam. The patient reports not checking blood sugar.  States blood sugar is okay if he is on the keto diet but has been off and on diet.  Patient is not currently taking any medications.  Notes BP elevated at 140/90.  Patient endorses changes in vision, not as clear.  Patient endorses feeling tired all the time.  Has CPAP but has not been wearing it in several months.  Patient endorses left-sided upper abdominal discomfort constant dull ache.  Pt having to strain to have BM.  States after eating feels like food is stuck/has sensation in center of chest.  Patient denies overt heartburn symptoms.  States in the past tried OTC PPI without relief.  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    Employer: [employer]  Tobacco Use   Smoking status: Never   Smokeless tobacco: Never  Vaping Use   Vaping Use: Never used  Substance and Sexual Activity   Alcohol use: No   Drug use: No   Sexual activity: Not on file  Other Topics Concern   Not on file  Social History Narrative   Not on file   Social Determinants of Health   Financial Resource Strain: Not on file  Food Insecurity: Not on file  Transportation Needs: Not on file  Physical Activity: Not on file  Stress: Not on file  Social Connections: Not on file  Intimate Partner Violence: Not on file   Health Maintenance  Topic Date Due   HIV Screening  Never done   Hepatitis C Screening  Never done   Zoster Vaccines- Shingrix (1 of 2) Never done   FOOT EXAM  11/27/2015   OPHTHALMOLOGY EXAM  03/27/2016   HEMOGLOBIN A1C  08/18/2020   COVID-19 Vaccine (6 - [place] series) 04/27/2022 (Originally 03/04/2021)   INFLUENZA  VACCINE  04/29/2022   TETANUS/TDAP  09/30/2022   COLONOSCOPY (Pts 45-94yr Insurance coverage will need to be confirmed)  02/06/2023   HPV VACCINES  Aged Out    The following portions of the patient's history were reviewed and updated as appropriate: allergies, current medications, past family history, past medical history, past social history, past surgical history, and problem list.  Review of Systems Pertinent items noted in HPI and remainder of comprehensive ROS otherwise negative.   Objective:    BP (!) 142/90 (BP Location: Left Arm, Patient Position: Sitting, Cuff Size: Large)   Pulse 79   Temp 98.5 F (36.9 C) (Oral)   Ht 5' 11.75" (1.822 m)   Wt 299 lb (135.6 kg)   SpO2 96%   BMI 40.83 kg/m  General appearance: alert, cooperative, and no distress Head: Normocephalic, without obvious abnormality, atraumatic Eyes: conjunctivae/corneas clear. PERRL, EOM's intact. Fundi benign. Ears: normal TM's and external ear canals both ears Nose: Nares normal. Septum midline. Mucosa normal. No drainage or sinus tenderness. Throat: lips, mucosa, and tongue normal; teeth and gums normal Neck: no adenopathy, no carotid bruit, no JVD, supple, symmetrical, trachea midline, and thyroid not enlarged, symmetric, no tenderness/mass/nodules Lungs: clear to auscultation bilaterally Heart: regular rate and rhythm, S1, S2 normal, no murmur, click, rub  or gallop Abdomen: soft, non-tender; bowel sounds normal; no masses,  no organomegaly Extremities: extremities normal, atraumatic, no cyanosis or edema Pulses: 2+ and symmetric Skin: Skin color, texture, turgor normal. No rashes or lesions Lymph nodes: Cervical, supraclavicular, and axillary nodes normal. Neurologic: Alert and oriented X 3, normal strength and tone. Normal symmetric reflexes. Normal coordination and gait    Assessment:    Healthy male exam lost to follow-up who presents with several chronic conditions   Plan:    Anticipatory  guidance given including wearing seatbelts, smoke detectors in the home, increasing physical activity, increasing p.o. intake of water and vegetables. See After Visit Summary for Counseling Recommendations  -labs -Colonoscopy done 02/05/2018 -Immunizations reviewed -Given handout -Next CPE in 1 year  Essential hypertension -Elevated -Recheck -We will likely need to restart medication. - Plan: CMP, TSH, T4, Free  OSA (obstructive sleep apnea)  -At last sleep study 6 years ago discussed repeating - Plan: CBC with Differential/Platelet, referral to sleep medicine  Dysphagia, unspecified type -Possibly related to GERD -Discussed supportive care including weight loss, PPI, diet modifications  - Plan: Ambulatory referral to Gastroenterology  Constipation, unspecified constipation type  - Plan: CMP, Ambulatory referral to Gastroenterology, Vitamin B12, Vitamin D, 25-hydroxy  Left sided abdominal pain -Possibly 2/2 constipation -Given handouts -Increase p.o. intake of water, fiber, decrease stress - Plan: Ambulatory referral to Gastroenterology  Diabetes 1.5, managed as type 2 ([place]) -Hemoglobin A1c 9.3% on 02/16/2020 -Not currently taking any medications -Anticipate patient will need to restart medications.  Further recommendations based on labs.  - Plan: Hemoglobin A1c, Microalbumin/Creatinine Ratio, Urine  Prostate cancer screening  - Plan: PSA  Mixed hyperlipidemia -Total cholesterol 223, LDL 160, HDL 44.3, triglycerides 92 on 02/16/2020 -Lifestyle modification strongly encouraged  - Plan: Lipid panel  Fatigue, unspecified -Likely multifactorial including increased stress, OSA not using CPAP, elevated blood sugar is not currently on medication for DM 2 -Obtain labs -Sleep study -Obtain Labs   Follow-up in 1 month, sooner if needed  Grier Mitts, MD

## 2022-04-15 ENCOUNTER — Other Ambulatory Visit (INDEPENDENT_AMBULATORY_CARE_PROVIDER_SITE_OTHER): Payer: Federal, State, Local not specified - PPO

## 2022-04-15 DIAGNOSIS — G4733 Obstructive sleep apnea (adult) (pediatric): Secondary | ICD-10-CM | POA: Diagnosis not present

## 2022-04-15 DIAGNOSIS — Z125 Encounter for screening for malignant neoplasm of prostate: Secondary | ICD-10-CM | POA: Diagnosis not present

## 2022-04-15 DIAGNOSIS — E782 Mixed hyperlipidemia: Secondary | ICD-10-CM

## 2022-04-15 DIAGNOSIS — I10 Essential (primary) hypertension: Secondary | ICD-10-CM

## 2022-04-15 DIAGNOSIS — E139 Other specified diabetes mellitus without complications: Secondary | ICD-10-CM | POA: Diagnosis not present

## 2022-04-15 DIAGNOSIS — K5900 Constipation, unspecified: Secondary | ICD-10-CM

## 2022-04-15 LAB — LIPID PANEL
Cholesterol: 209 mg/dL — ABNORMAL HIGH (ref 0–200)
HDL: 41.2 mg/dL (ref >39.00)
LDL Cholesterol: 142 mg/dL — ABNORMAL HIGH (ref 0–99)
NonHDL: 167.41
Total CHOL/HDL Ratio: 5
Triglycerides: 125 mg/dL (ref 0.0–149.0)
VLDL: 25 mg/dL (ref 0.0–40.0)

## 2022-04-15 LAB — COMPREHENSIVE METABOLIC PANEL
ALT: 17 U/L (ref 0–53)
AST: 14 U/L (ref 0–37)
Albumin: 4 g/dL (ref 3.5–5.2)
Alkaline Phosphatase: 73 U/L (ref 39–117)
BUN: 17 mg/dL (ref 6–23)
CO2: 29 mEq/L (ref 19–32)
Calcium: 8.8 mg/dL (ref 8.4–10.5)
Chloride: 97 mEq/L (ref 96–112)
Creatinine, Ser: 1.18 mg/dL (ref 0.40–1.50)
GFR: 68.94 mL/min (ref >60.00)
Glucose, Bld: 258 mg/dL — ABNORMAL HIGH (ref 70–99)
Potassium: 4.3 mEq/L (ref 3.5–5.1)
Sodium: 134 mEq/L — ABNORMAL LOW (ref 135–145)
Total Bilirubin: 0.3 mg/dL (ref 0.2–1.2)
Total Protein: 6.8 g/dL (ref 6.0–8.3)

## 2022-04-15 LAB — CBC WITH DIFFERENTIAL/PLATELET
Basophils Absolute: 0 10*3/uL (ref 0.0–0.1)
Basophils Relative: 0.6 % (ref 0.0–3.0)
Eosinophils Absolute: 0.2 10*3/uL (ref 0.0–0.7)
Eosinophils Relative: 3 % (ref 0.0–5.0)
HCT: 38.2 % — ABNORMAL LOW (ref 39.0–52.0)
Hemoglobin: 12.5 g/dL — ABNORMAL LOW (ref 13.0–17.0)
Lymphocytes Relative: 28.3 % (ref 12.0–46.0)
Lymphs Abs: 1.8 10*3/uL (ref 0.7–4.0)
MCHC: 32.6 g/dL (ref 30.0–36.0)
MCV: 89.8 fl (ref 78.0–100.0)
Monocytes Absolute: 0.5 10*3/uL (ref 0.1–1.0)
Monocytes Relative: 7.8 % (ref 3.0–12.0)
Neutro Abs: 3.8 10*3/uL (ref 1.4–7.7)
Neutrophils Relative %: 60.3 % (ref 43.0–77.0)
Platelets: 232 10*3/uL (ref 150.0–400.0)
RBC: 4.26 Mil/uL (ref 4.22–5.81)
RDW: 14.4 % (ref 11.5–15.5)
WBC: 6.2 10*3/uL (ref 4.0–10.5)

## 2022-04-15 LAB — VITAMIN B12: Vitamin B-12: 300 pg/mL (ref 211–911)

## 2022-04-15 LAB — MICROALBUMIN / CREATININE URINE RATIO
Creatinine,U: 99.2 mg/dL
Microalb Creat Ratio: 0.7 mg/g (ref 0.0–30.0)
Microalb, Ur: <0.7 mg/dL (ref 0.0–1.9)

## 2022-04-15 LAB — T4, FREE: Free T4: 0.78 ng/dL (ref 0.60–1.60)

## 2022-04-15 LAB — HEMOGLOBIN A1C: Hgb A1c MFr Bld: 11.2 % — ABNORMAL HIGH (ref 4.6–6.5)

## 2022-04-15 LAB — PSA: PSA: 0.44 ng/mL (ref 0.10–4.00)

## 2022-04-15 LAB — VITAMIN D 25 HYDROXY (VIT D DEFICIENCY, FRACTURES): VITD: 22.33 ng/mL — ABNORMAL LOW (ref 30.00–100.00)

## 2022-04-15 LAB — TSH: TSH: 7.54 u[IU]/mL — ABNORMAL HIGH (ref 0.35–5.50)

## 2022-04-17 ENCOUNTER — Telehealth: Payer: Self-pay | Admitting: Family Medicine

## 2022-04-17 ENCOUNTER — Other Ambulatory Visit: Payer: Federal, State, Local not specified - PPO

## 2022-04-17 NOTE — Telephone Encounter (Signed)
Pt viewed lab results and is concerned. Requests a call to discuss

## 2022-04-18 NOTE — Telephone Encounter (Signed)
Lab has not review by provider yet.

## 2022-04-23 ENCOUNTER — Encounter: Payer: Self-pay | Admitting: Family Medicine

## 2022-04-25 ENCOUNTER — Other Ambulatory Visit: Payer: Self-pay | Admitting: Family Medicine

## 2022-04-25 DIAGNOSIS — E038 Other specified hypothyroidism: Secondary | ICD-10-CM

## 2022-04-25 DIAGNOSIS — E559 Vitamin D deficiency, unspecified: Secondary | ICD-10-CM

## 2022-04-25 MED ORDER — VITAMIN D (ERGOCALCIFEROL) 1.25 MG (50000 UNIT) PO CAPS: 50000.0000 [IU] | ORAL_CAPSULE | ORAL | 0 refills | Status: DC

## 2022-04-28 ENCOUNTER — Other Ambulatory Visit: Payer: Self-pay

## 2022-04-28 ENCOUNTER — Telehealth: Payer: Self-pay | Admitting: Family Medicine

## 2022-04-28 DIAGNOSIS — E559 Vitamin D deficiency, unspecified: Secondary | ICD-10-CM

## 2022-04-28 DIAGNOSIS — E038 Other specified hypothyroidism: Secondary | ICD-10-CM

## 2022-04-28 DIAGNOSIS — E782 Mixed hyperlipidemia: Secondary | ICD-10-CM

## 2022-04-28 NOTE — Telephone Encounter (Signed)
Spoke to patient. See note.  

## 2022-04-28 NOTE — Telephone Encounter (Signed)
Pt returning CMA's call Renold Don) CMA not available = Please call back when you can.

## 2022-04-30 NOTE — Telephone Encounter (Signed)
Contacted patient and left detail message to call back and can schedule appointment with the front desk in regards to appointment.

## 2022-05-07 ENCOUNTER — Ambulatory Visit (INDEPENDENT_AMBULATORY_CARE_PROVIDER_SITE_OTHER): Payer: Federal, State, Local not specified - PPO

## 2022-05-07 DIAGNOSIS — E538 Deficiency of other specified B group vitamins: Secondary | ICD-10-CM | POA: Diagnosis not present

## 2022-05-07 MED ORDER — CYANOCOBALAMIN 1000 MCG/ML IJ SOLN
1000.0000 ug | Freq: Once | INTRAMUSCULAR | Status: AC
  Administered 2022-05-07: 1000 ug via INTRAMUSCULAR

## 2022-05-07 NOTE — Progress Notes (Cosign Needed Addendum)
Per orders of Billie Ruddy, MD, injection of B12 given in   deltoid by Franco Collet. Patient tolerated injection well.  Lab Results  Component Value Date   VITAMINB12 300 04/15/2022

## 2022-05-07 NOTE — Patient Instructions (Signed)
Health Maintenance Due  Topic Date Due   HIV Screening  Never done   Hepatitis C Screening  Never done   Zoster Vaccines- Shingrix (1 of 2) Never done   FOOT EXAM  11/27/2015   OPHTHALMOLOGY EXAM  03/27/2016   COVID-19 Vaccine (6 - Pf[employer] series) 03/04/2021   INFLUENZA VACCINE  04/29/2022      Row Labels 04/11/2022   10:11 AM  Depression screen PHQ 2/9   Section Header. No data exists in this row.   Decreased Interest   0  Down, Depressed, Hopeless   0  PHQ - 2 Score   0  Altered sleeping   0  Tired, decreased energy   3  Change in appetite   0  Feeling bad or failure about yourself    0  Trouble concentrating   0  Moving slowly or fidgety/restless   0  Suicidal thoughts   0  PHQ-9 Score   3  Difficult doing work/chores   Somewhat difficult

## 2022-05-15 ENCOUNTER — Telehealth: Payer: Self-pay

## 2022-05-15 NOTE — Telephone Encounter (Signed)
Spoke with pt about recent lab work concerning his A1c and blood sugar. Informed pt provider would like him to f/u to get these under control. Offered to schedule appt, pt stated he would call back after he looks at his schedule.

## 2022-06-13 ENCOUNTER — Ambulatory Visit (INDEPENDENT_AMBULATORY_CARE_PROVIDER_SITE_OTHER): Payer: Federal, State, Local not specified - PPO

## 2022-06-13 DIAGNOSIS — E538 Deficiency of other specified B group vitamins: Secondary | ICD-10-CM

## 2022-06-13 MED ORDER — CYANOCOBALAMIN 1000 MCG/ML IJ SOLN
1000.0000 ug | Freq: Once | INTRAMUSCULAR | Status: AC
  Administered 2022-06-13: 1000 ug via INTRAMUSCULAR

## 2022-06-13 NOTE — Progress Notes (Signed)
Per orders of Dr. Volanda Napoleon, injection of VitB 12 1082mg  given by KEncarnacion Slates Patient tolerated injection well.   3rd VitB 12 injection is due next month.

## 2022-06-23 ENCOUNTER — Telehealth: Payer: Self-pay | Admitting: Family Medicine

## 2022-06-23 NOTE — Telephone Encounter (Signed)
Patient had positive ppd years ago and is required to get chest xray for his new job.

## 2022-06-24 NOTE — Telephone Encounter (Signed)
Pt was advised to f/u shortly after last visit in July when A1C was 11%.  He needs to schedule an appt.

## 2022-06-26 NOTE — Telephone Encounter (Signed)
Pt starts new job Monday and unsure of when he can take off. Will contact job and call us to schedule

## 2022-07-04 ENCOUNTER — Ambulatory Visit (INDEPENDENT_AMBULATORY_CARE_PROVIDER_SITE_OTHER): Payer: Federal, State, Local not specified - PPO

## 2022-07-04 ENCOUNTER — Encounter: Payer: Self-pay | Admitting: Family Medicine

## 2022-07-04 ENCOUNTER — Ambulatory Visit: Payer: Federal, State, Local not specified - PPO | Admitting: Family Medicine

## 2022-07-04 VITALS — BP 130/80 | HR 88 | Temp 97.8°F | Ht 71.75 in | Wt 300.3 lb

## 2022-07-04 DIAGNOSIS — E139 Other specified diabetes mellitus without complications: Secondary | ICD-10-CM

## 2022-07-04 DIAGNOSIS — Z8611 Personal history of tuberculosis: Secondary | ICD-10-CM | POA: Diagnosis not present

## 2022-07-04 DIAGNOSIS — Z9289 Personal history of other medical treatment: Secondary | ICD-10-CM

## 2022-07-04 MED ORDER — OZEMPIC (0.25 OR 0.5 MG/DOSE) 2 MG/3ML ~~LOC~~ SOPN: 0.2500 mg | PEN_INJECTOR | SUBCUTANEOUS | 2 refills | Status: DC

## 2022-07-04 NOTE — Patient Instructions (Signed)
Start the Ozempic 0.25 mg Advance once weekly.   After one month, if tolerating well, increase to 0.5 mg once weekly.

## 2022-07-04 NOTE — Progress Notes (Signed)
Established Patient Office Visit  Subjective   Patient ID: Leonard Rojas, male    DOB: 1966-02-23  Age: 56 y.o. MRN: [id number]  Chief Complaint  Patient presents with   positive tb    HPI   Leonard Rojas is seen today for the following items:  He had been scheduled with his primary but because of unexpected medical leave is seeing [place] today until primary returns.  He states he needs a chest x-ray for his employer.  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  He denies any cough.  No fever, hemoptysis, or recent weight loss.  He has type 2 diabetes.  Poorly controlled with most recent A1c 11.2%.  He was started on metformin but has had consistent diarrhea symptoms.  Declines further metformin use.  He has struggled with his weight for some time.  Had some brief success with keto diet but could not maintain that.  He would like to explore other options perhaps that could assist with weight loss.  Past Medical History:  Diagnosis Date   Diabetes mellitus type II    controlled with diet   FHx: allergies    tb skin test   Hyperlipidemia    Hypertension    Obese    Sleep apnea    borderline-does use CPAP occ.    Past Surgical History:  Procedure Laterality Date   WISDOM TOOTH EXTRACTION      reports that he has never smoked. He has never used smokeless tobacco. He reports that he does not drink alcohol and does not use drugs. family history includes COPD in his father; Diabetes in his father, maternal grandfather, maternal grandmother, and paternal grandmother; Hyperlipidemia in his father; Hypertension in his father. No Known Allergies   Review of Systems  Constitutional:  Negative for chills, fever, malaise/fatigue and weight loss.  Eyes:  Negative for blurred vision.  Respiratory:  Negative for cough, hemoptysis, shortness of breath and wheezing.    Cardiovascular:  Negative for chest pain.  Gastrointestinal:  Negative for abdominal pain.  Neurological:  Negative for dizziness, weakness and headaches.      Objective:     BP 130/80 (BP Location: Left Arm, Patient Position: Sitting, Cuff Size: Large)   Pulse 88   Temp 97.8 F (36.6 C) (Oral)   Ht 5' 11.75" (1.822 m)   Wt (!) 300 lb 4.8 oz (136.2 kg)   SpO2 96%   BMI 41.01 kg/m  BP Readings from Last 3 Encounters:  07/04/22 130/80  04/11/22 130/86  02/16/20 128/88   Wt Readings from Last 3 Encounters:  07/04/22 (!) 300 lb 4.8 oz (136.2 kg)  04/11/22 299 lb (135.6 kg)  02/16/20 298 lb (135.2 kg)      Physical Exam Vitals reviewed.  Cardiovascular:     Rate and Rhythm: Normal rate and regular rhythm.  Pulmonary:     Effort: Pulmonary effort is normal.     Breath sounds: Normal breath sounds. No wheezing or rales.  Musculoskeletal:     Right lower leg: No edema.     Left lower leg: No edema.  Neurological:     Mental Status: He is alert.      No results found for any visits on 07/04/22.    The 10-year ASCVD risk score (Arnett DK, et al., 2019) is: 22.6%    Assessment & Plan:   #  1 history of positive PPD.  Patient asymptomatic.  He is requesting chest x-ray as requested by his employer.  #2 adult onset diabetes.  Intolerance with metformin with diarrhea.  We discussed other potential classes of medications such as SGLT2 or GLP-1.  Given his weight struggles consider trial of GLP-1 medication.  He has no history of pancreatitis and no family history of medullary thyroid cancer.  We discussed starting Ozempic 0.25 mg subcutaneous once weekly for 1 month and if tolerating well at that point increase to 0.5 mg. -Reviewed potential side effects -Set up follow-up with primary within 2 to 3 months   Return in about 1 month (around 08/04/2022).    Carolann Littler, MD

## 2022-07-14 ENCOUNTER — Ambulatory Visit: Payer: Federal, State, Local not specified - PPO

## 2022-07-21 ENCOUNTER — Encounter: Payer: Self-pay | Admitting: Family Medicine

## 2022-07-21 NOTE — Telephone Encounter (Signed)
disregard

## 2022-11-09 DIAGNOSIS — R509 Fever, unspecified: Secondary | ICD-10-CM | POA: Diagnosis not present

## 2022-11-09 DIAGNOSIS — U071 COVID-19: Secondary | ICD-10-CM | POA: Diagnosis not present

## 2022-12-19 ENCOUNTER — Telehealth: Payer: Self-pay | Admitting: Family Medicine

## 2022-12-19 NOTE — Telephone Encounter (Signed)
Pt would like to transfer from Dr. Volanda Napoleon to Dr. Elease Hashimoto. Is this okay with both providers?

## 2022-12-19 NOTE — Telephone Encounter (Signed)
Ok

## 2023-01-09 ENCOUNTER — Encounter: Payer: Self-pay | Admitting: Family Medicine

## 2023-01-09 ENCOUNTER — Encounter: Payer: Self-pay | Admitting: Gastroenterology

## 2023-01-09 ENCOUNTER — Ambulatory Visit: Payer: Federal, State, Local not specified - PPO | Admitting: Family Medicine

## 2023-01-09 VITALS — BP 140/70 | HR 80 | Temp 98.0°F | Ht 71.75 in | Wt 282.4 lb

## 2023-01-09 DIAGNOSIS — K625 Hemorrhage of anus and rectum: Secondary | ICD-10-CM | POA: Diagnosis not present

## 2023-01-09 DIAGNOSIS — K649 Unspecified hemorrhoids: Secondary | ICD-10-CM | POA: Diagnosis not present

## 2023-01-09 DIAGNOSIS — H5350 Unspecified color vision deficiencies: Secondary | ICD-10-CM

## 2023-01-09 DIAGNOSIS — E1169 Type 2 diabetes mellitus with other specified complication: Secondary | ICD-10-CM | POA: Diagnosis not present

## 2023-01-09 DIAGNOSIS — K5909 Other constipation: Secondary | ICD-10-CM | POA: Diagnosis not present

## 2023-01-09 DIAGNOSIS — I10 Essential (primary) hypertension: Secondary | ICD-10-CM

## 2023-01-09 LAB — COMPREHENSIVE METABOLIC PANEL
ALT: 15 U/L (ref 0–53)
AST: 15 U/L (ref 0–37)
Albumin: 4.6 g/dL (ref 3.5–5.2)
Alkaline Phosphatase: 60 U/L (ref 39–117)
BUN: 14 mg/dL (ref 6–23)
CO2: 29 mEq/L (ref 19–32)
Calcium: 10 mg/dL (ref 8.4–10.5)
Chloride: 102 mEq/L (ref 96–112)
Creatinine, Ser: 1.16 mg/dL (ref 0.40–1.50)
GFR: 70 mL/min (ref >60.00)
Glucose, Bld: 84 mg/dL (ref 70–99)
Potassium: 4.3 mEq/L (ref 3.5–5.1)
Sodium: 141 mEq/L (ref 135–145)
Total Bilirubin: 0.4 mg/dL (ref 0.2–1.2)
Total Protein: 7.9 g/dL (ref 6.0–8.3)

## 2023-01-09 LAB — CBC WITH DIFFERENTIAL/PLATELET
Basophils Absolute: 0 10*3/uL (ref 0.0–0.1)
Basophils Relative: 0.5 % (ref 0.0–3.0)
Eosinophils Absolute: 0.1 10*3/uL (ref 0.0–0.7)
Eosinophils Relative: 3.3 % (ref 0.0–5.0)
HCT: 42.2 % (ref 39.0–52.0)
Hemoglobin: 13.7 g/dL (ref 13.0–17.0)
Lymphocytes Relative: 30.8 % (ref 12.0–46.0)
Lymphs Abs: 1.2 10*3/uL (ref 0.7–4.0)
MCHC: 32.5 g/dL (ref 30.0–36.0)
MCV: 90.1 fl (ref 78.0–100.0)
Monocytes Absolute: 0.3 10*3/uL (ref 0.1–1.0)
Monocytes Relative: 8.2 % (ref 3.0–12.0)
Neutro Abs: 2.3 10*3/uL (ref 1.4–7.7)
Neutrophils Relative %: 57.2 % (ref 43.0–77.0)
Platelets: 261 10*3/uL (ref 150.0–400.0)
RBC: 4.68 Mil/uL (ref 4.22–5.81)
RDW: 15.4 % (ref 11.5–15.5)
WBC: 4 10*3/uL (ref 4.0–10.5)

## 2023-01-09 LAB — TSH: TSH: 3.35 u[IU]/mL (ref 0.35–5.50)

## 2023-01-09 LAB — T4, FREE: Free T4: 0.87 ng/dL (ref 0.60–1.60)

## 2023-01-09 LAB — HEMOGLOBIN A1C: Hgb A1c MFr Bld: 7.4 % — ABNORMAL HIGH (ref 4.6–6.5)

## 2023-01-09 MED ORDER — OZEMPIC (0.25 OR 0.5 MG/DOSE) 2 MG/3ML ~~LOC~~ SOPN
0.5000 mg | PEN_INJECTOR | SUBCUTANEOUS | 2 refills | Status: DC
Start: 2023-01-09 — End: 2023-04-15

## 2023-01-09 NOTE — Patient Instructions (Addendum)
If you have not done so already you should schedule a diabetic retinopathy screening with your ophthalmologist or optometrist.  A referral was placed for you to see the gastroenterologist to further evaluate ongoing gastrointestinal issues.  Please expect a phone call about scheduling this appointment.  We will have you follow-up in about 3 months for your physical and to follow-up on chronic conditions.

## 2023-01-09 NOTE — Progress Notes (Signed)
Established Patient Office Visit   Subjective  Patient ID: Leonard Rojas, male    DOB: 02-Sep-1966  Age: 57 y.o. MRN: [id number]  No chief complaint on file.   Patient is a 57 year old male with PMH SIG for DM 2, HTN, who presents for ongoing concerns.  Patient endorses chronic constipation.  Was having a BM weekly.  Start taking Dulcolax 2 tabs daily over the last week.  Endorses having a bowel movement yesterday and 2 days prior.  Drinking at least 120 ounces of water per day.  Increasing intake of fruits and vegetables.  Has noticed a growth on rectum.  States feels different from a hemorrhoid.  Also endorses rectal bleeding times several months.  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  Patient started taking iron daily 2/2 feeling increased heart rate.  Patient with DM2.  Was started on Ozempic in October by colleague in this provider's absence.  Still taking 0.25 mg weekly as was unable to get increased dose from pharmacy.  Unable to tolerate metformin 2/2 GI upset.    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/2016   Family history of prostate cancer in father 09/17/2016   OSA (obstructive sleep apnea) 12/07/2013   Hyperlipidemia 10/11/2008   Essential hypertension 06/24/2007   Diabetes 1.5, managed as type 2 05/04/2007   Social History   Tobacco Use   Smoking status: Never   Smokeless tobacco: Never  Vaping Use   Vaping Use: Never used  Substance Use Topics   Alcohol use: No   Drug use: No   Family History  Problem Relation Age of Onset   COPD Father    Diabetes Father    Hyperlipidemia Father    Hypertension Father    Diabetes Maternal Grandmother    Diabetes Maternal Grandfather    Diabetes Paternal Grandmother    Colon cancer Neg Hx    Esophageal  cancer Neg Hx    Stomach cancer Neg Hx    No Known Allergies    ROS Negative unless stated above    Objective:     BP (!) 140/70 (BP Location: Left Arm, Patient Position: Sitting, Cuff Size: Normal)   Pulse 80   Temp 98 F (36.7 C) (Oral)   Ht 5' 11.75" (1.822 m)   Wt 282 lb 6.4 oz (128.1 kg)   SpO2 99%   BMI 38.57 kg/m  BP Readings from Last 3 Encounters:  01/09/23 (!) 140/70  07/04/22 130/80  04/11/22 130/86   Wt Readings from Last 3 Encounters:  01/09/23 282 lb 6.4 oz (128.1 kg)  07/04/22 (!) 300 lb 4.8 oz (136.2 kg)  04/11/22 299 lb (135.6 kg)      Physical Exam Exam conducted with a chaperone present.  Constitutional:      General: He is not in acute distress.    Appearance: Normal appearance.  HENT:     Head: Normocephalic and atraumatic.     Nose: Nose normal.     Mouth/Throat:     Mouth: Mucous membranes are moist.  Eyes:     Extraocular Movements: Extraocular movements intact.     Conjunctiva/sclera: Conjunctivae normal.     Pupils: Pupils are equal, round, and reactive to light.  Cardiovascular:     Rate and Rhythm: Normal rate and  regular rhythm.     Heart sounds: Normal heart sounds. No murmur heard.    No gallop.  Pulmonary:     Effort: Pulmonary effort is normal. No respiratory distress.     Breath sounds: Normal breath sounds. No wheezing, rhonchi or rales.  Genitourinary:    Rectum: Guaiac result negative. External hemorrhoid present.     Comments: Increased anal tone. Skin:    General: Skin is warm and dry.  Neurological:     Mental Status: He is alert and oriented to person, place, and time.    No results found for any visits on 01/09/23.    Assessment & Plan:  Chronic constipation -     Comprehensive metabolic panel -     TSH -     T4, free -     Ambulatory referral to Gastroenterology  Hemorrhoids, unspecified hemorrhoid type -     Ambulatory referral to Gastroenterology  Rectal bleeding -     CBC with  Differential/Platelet -     Comprehensive metabolic panel -     Iron, TIBC and Ferritin Panel -     Ambulatory referral to Gastroenterology  Type 2 diabetes mellitus with other specified complication, without long-term current use of insulin -Uncontrolled. -Last hemoglobin A1c 11.2 on 04/15/2022. -Discussed the importance of follow-up -Pt schedule diabetic retinopathy screening -Foot exam at next [facility]. -     Hemoglobin A1c -     Ozempic (0.25 or 0.5 MG/DOSE); Inject 0.5 mg into the skin once a week.  Dispense: 9 mL; Refill: 2  Color blindness  Essential hypertension  Guaiac negative in clinic.  Obtain labs to evaluate for anemia given rectal bleeding.  Referral to GI.  Daily bowel regimen.  Last colonoscopy 02/05/2018.  New Rx for increased dose of Ozempic sent to pharmacy.  Recheck BP.  Monitor BP at home.  For continued elevation consistently greater than 140/90 increase lisinopril from 5 mg to 10 mg.  Given strict precautions.  Return in about 3 months (around 04/10/2023).   Deeann Saint, MD

## 2023-01-10 LAB — IRON,TIBC AND FERRITIN PANEL
%SAT: 27 % (calc) (ref 20–48)
Ferritin: 302 ng/mL (ref 38–380)
Iron: 76 ug/dL (ref 50–180)
TIBC: 280 mcg/dL (calc) (ref 250–425)

## 2023-03-20 ENCOUNTER — Encounter: Payer: Self-pay | Admitting: Gastroenterology

## 2023-03-20 ENCOUNTER — Ambulatory Visit: Payer: Federal, State, Local not specified - PPO | Admitting: Gastroenterology

## 2023-03-20 VITALS — BP 120/70 | HR 86 | Ht 71.0 in | Wt 266.0 lb

## 2023-03-20 DIAGNOSIS — R1012 Left upper quadrant pain: Secondary | ICD-10-CM

## 2023-03-20 DIAGNOSIS — K625 Hemorrhage of anus and rectum: Secondary | ICD-10-CM | POA: Diagnosis not present

## 2023-03-20 DIAGNOSIS — Z86010 Personal history of colonic polyps: Secondary | ICD-10-CM | POA: Diagnosis not present

## 2023-03-20 DIAGNOSIS — K5909 Other constipation: Secondary | ICD-10-CM

## 2023-03-20 MED ORDER — NA SULFATE-K SULFATE-MG SULF 17.5-3.13-1.6 GM/177ML PO SOLN: 1.0000 | Freq: Once | ORAL | 0 refills | Status: AC

## 2023-03-20 NOTE — Progress Notes (Signed)
[name] Consult Note:  History: Leonard Rojas 03/20/2023  Referring provider: Deeann Saint, MD  Reason for consult/chief complaint: chronic constipation (Has had rectal bleeding last time 6 months ago, left sided abdominal discomfort, using Miralax and that is helping, PCP said Leonard Rojas had a hemorrhoid-external )   Subjective  HPI: Leonard Rojas is a 57 year old man referred to [place] after PCP visit 2 months ago when Leonard Rojas described chronic constipation with anorectal discomfort and rectal bleeding.  Leonard Rojas felt as if there was a lump or other finding in the perianal area.  Dr. Sherlon Handing rectal exam documents heme-negative stool, increase sphincter tone and external hemorrhoid.  Leonard Rojas had suboptimally controlled diabetes with a hemoglobin A1c of 11.2 in July 2023, and was started on GLP-1 agonist October 2023.  Hemoglobin A1c down to 7.42 months ago. His last colonoscopy was a screening exam with Dr. Christella Hartigan in May 2019, which was a complete exam with good preparation.  A diminutive transverse colon tubular adenoma was removed, and there was mildly erythematous or polypoid rectal tissue which biopsied normal.  Leonard Rojas reports a few years of chronic intermittent left-sided abdominal pressure type discomfort and constipation where Leonard Rojas might have a BM 2-3 times a week, often with straining and firm stool.  Leonard Rojas is clear that this problem predates starting Ozempic last year.  Leonard Rojas has had considerable improvement in diabetes control with medicines and also significant diet and lifestyle changes. There was an episode of rectal bleeding several months ago that occurred during a period of straining and hard stool, no further episodes since then.  Leonard Rojas was concerned about the perianal abnormality that Leonard Rojas has felt for some time.  Naturally, has had some concerns about the possibility of obstructive cause of constipation.  Leonard Rojas has tried multiple treatments including probiotics, increase fiber, water, activity and  stool softeners.  Leonard Rojas will take an occasional Dulcolax, but has gotten the most recent improvement taking MiraLAX daily, and with that we will have a bowel movement about every day.   ROS:  Review of Systems  Constitutional:  Negative for appetite change and unexpected weight change.  HENT:  Negative for mouth sores and voice change.   Eyes:  Negative for pain and redness.  Respiratory:  Negative for cough and shortness of breath.   Cardiovascular:  Negative for chest pain and palpitations.  Genitourinary:  Negative for dysuria and hematuria.  Musculoskeletal:  Negative for arthralgias and myalgias.  Skin:  Negative for pallor and rash.  Neurological:  Negative for weakness and headaches.  Hematological:  Negative for adenopathy.     Past Medical History: Past Medical History:  Diagnosis Date   Diabetes mellitus type II    FHx: allergies    tb skin test   Hyperlipidemia    Hypertension    Obese    Sleep apnea    borderline-does use CPAP occ.      Past Surgical History: Past Surgical History:  Procedure Laterality Date   COLONOSCOPY     WISDOM TOOTH EXTRACTION       Family History: Family History  Problem Relation Age of Onset   Other Mother        pre-diabetic   Diabetes Father    Hyperlipidemia Father    Hypertension Father    Prostate cancer Father    Diabetes Maternal Grandmother    Diabetes Maternal Grandfather    Diabetes Paternal Grandmother    Colon cancer Neg Hx    Esophageal cancer Neg Hx  Stomach cancer Neg Hx     Social History: Social History   Socioeconomic History   Marital status: Married    Spouse name: Not on file   Number of children: 1   Years of education: Not on file   Highest education level: Not on file  Occupational History   Occupation: physical therapist  Tobacco Use   Smoking status: Never   Smokeless tobacco: Never  Vaping Use   Vaping Use: Never used  Substance and Sexual Activity   Alcohol use: No   Drug use:  No   Sexual activity: Not on file  Other Topics Concern   Not on file  Social History Narrative   Not on file   Social Determinants of Health   Financial Resource Strain: Not on file  Food Insecurity: Not on file  Transportation Needs: Not on file  Physical Activity: Not on file  Stress: Not on file  Social Connections: Not on file    Allergies: No Known Allergies  Outpatient Meds: Current Outpatient Medications  Medication Sig Dispense Refill   amphetamine-dextroamphetamine (ADDERALL) 10 MG tablet Take 1 tablet (10 mg total) by mouth as needed. 30 tablet 0   aspirin 81 MG tablet Take 160 mg by mouth as needed for pain.     glucose blood (ONE TOUCH TEST STRIPS) test strip Use daily to test glucose 100 each 3   glucose blood (ONE TOUCH TEST STRIPS) test strip Use as instructed 100 each 3   lisinopril (ZESTRIL) 5 MG tablet Take 1 tablet (5 mg total) by mouth daily. 90 tablet 3   Semaglutide,0.25 or 0.5MG /DOS, (OZEMPIC, 0.25 OR 0.5 MG/DOSE,) 2 MG/3ML SOPN Inject 0.5 mg into the skin once a week. 9 mL 2   Current Facility-Administered Medications  Medication Dose Route Frequency Provider Last Rate Last Admin   0.9 %  sodium chloride infusion  500 mL Intravenous Once Rachael Fee, MD          ___________________________________________________________________ Objective   Exam:  BP 120/70   Pulse 86   Ht 5\' 11"  (1.803 m)   Wt 266 lb (120.7 kg)   BMI 37.10 kg/m  Wt Readings from Last 3 Encounters:  03/20/23 266 lb (120.7 kg)  01/09/23 282 lb 6.4 oz (128.1 kg)  07/04/22 (!) 300 lb 4.8 oz (136.2 kg)    General: Well-appearing Eyes: sclera anicteric, no redness ENT: oral mucosa moist without lesions, no cervical or supraclavicular lymphadenopathy CV: Regular without appreciable murmur, no JVD, no peripheral edema Resp: clear to auscultation bilaterally, normal RR and effort noted GI: soft, no tenderness, with active bowel sounds. No guarding or palpable  organomegaly noted. Skin; warm and dry, no rash or jaundice noted Neuro: awake, alert and oriented x 3. Normal gross motor function and fluent speech Perianal exam reveals a redundant anterior perianal skin fold, no external hemorrhoids.  Mildly increased resting sphincter tone, no palpable internal lesions, soft brown stool Labs:  Hemoglobin A1c values as noted above  July 2023-TSH elevated at 7.54, but normal free T4 at 0.8  Normal TSH and free T4 2 months ago   Assessment: Encounter Diagnoses  Name Primary?   Chronic constipation Yes   Rectal bleeding    History of colonic polyps    LUQ pain     A few years of altered bowel habit with constipation, left-sided abdominal pain and episode of rectal bleeding.  The bleeding was probably benign anal rectal bleeding, hemorrhoids or small fissure that healed.  That  said, Leonard Rojas certainly should have a colonoscopy to rule out obstructive or neoplastic cause of both constipation and bleeding, especially having had a precancerous polyp 5 years ago.  Leonard Rojas was agreeable after discussion of procedure and risks.  The benefits and risks of the planned procedure were described in detail with the patient or (when appropriate) their health care proxy.  Risks were outlined as including, but not limited to, bleeding, infection, perforation, adverse medication reaction leading to cardiac or pulmonary decompensation, pancreatitis (if ERCP).  The limitation of incomplete mucosal visualization was also discussed.  No guarantees or warranties were given.  The fact that his diabetes was previously under poor control may have contributed to some altered GI motility leading to constipation.  Hopefully with time and improve diabetic control this may improve.  Talked about the possibility of other prescription medicines, though they are of variable benefit and side effect profile.  Leonard Rojas might want to discuss it later, but generally would prefer not to be on any additional  prescription medicines if possible. Plan:  Leonard Rojas will continue his current plan of daily MiraLAX, colonoscopy will be scheduled, and further advice to follow that.  Thank you for the courtesy of this consult.  Please call me with any questions or concerns.  Leonard Rojas  CC: Referring provider noted above

## 2023-03-20 NOTE — Patient Instructions (Addendum)
_______________________________________________________  If your blood pressure at your visit was 140/90 or greater, please contact your primary care physician to follow up on this.  _______________________________________________________  If you are age 58 or older, your body mass index should be between 23-30. Your Body mass index is 37.1 kg/m. If this is out of the aforementioned range listed, please consider follow up with your Primary Care Provider.  If you are age 35 or younger, your body mass index should be between 19-25. Your Body mass index is 37.1 kg/m. If this is out of the aformentioned range listed, please consider follow up with your Primary Care Provider.   ________________________________________________________  The [facility] providers would like to encourage you to use [facility] to communicate with providers for non-urgent requests or questions.  Due to long hold times on the telephone, sending your provider a message by [facility] may be a faster and more efficient way to get a response.  Please allow 48 business hours for a response.  Please remember that this is for non-urgent requests.  _______________________________________________________   GLP 1 AGONIST Weekly injectables   ([facility], [place], [place], [place], [place], [place])    Hold for 7 days prior to the procedure    You have been scheduled for a colonoscopy. Please follow written instructions given to you at your visit today.  Please pick up your prep supplies at the pharmacy within the next 1-3 days. If you use inhalers (even only as needed), please bring them with you on the day of your procedure.  Due to recent changes in healthcare laws, you may see the results of your imaging and laboratory studies on MyChart before your provider has had a chance to review them.  We understand that in some cases there may be results that are confusing or concerning to you. Not all laboratory results come back  in the same time frame and the provider may be waiting for multiple results in order to interpret others.  Please give [place] 48 hours in order for your provider to thoroughly review all the results before contacting the office for clarification of your results.    It was a pleasure to see you today!  Thank you for trusting me with your gastrointestinal care!

## 2023-04-03 ENCOUNTER — Encounter: Payer: Self-pay | Admitting: Gastroenterology

## 2023-04-15 ENCOUNTER — Ambulatory Visit (INDEPENDENT_AMBULATORY_CARE_PROVIDER_SITE_OTHER): Payer: Federal, State, Local not specified - PPO

## 2023-04-15 ENCOUNTER — Encounter: Payer: Self-pay | Admitting: Family Medicine

## 2023-04-15 ENCOUNTER — Ambulatory Visit (INDEPENDENT_AMBULATORY_CARE_PROVIDER_SITE_OTHER): Payer: Federal, State, Local not specified - PPO | Admitting: Family Medicine

## 2023-04-15 VITALS — BP 118/86 | HR 82 | Temp 98.3°F | Ht 71.0 in | Wt 263.2 lb

## 2023-04-15 DIAGNOSIS — E139 Other specified diabetes mellitus without complications: Secondary | ICD-10-CM

## 2023-04-15 DIAGNOSIS — Z125 Encounter for screening for malignant neoplasm of prostate: Secondary | ICD-10-CM | POA: Diagnosis not present

## 2023-04-15 DIAGNOSIS — I10 Essential (primary) hypertension: Secondary | ICD-10-CM | POA: Diagnosis not present

## 2023-04-15 DIAGNOSIS — M7989 Other specified soft tissue disorders: Secondary | ICD-10-CM | POA: Diagnosis not present

## 2023-04-15 DIAGNOSIS — Z1159 Encounter for screening for other viral diseases: Secondary | ICD-10-CM | POA: Diagnosis not present

## 2023-04-15 DIAGNOSIS — M79672 Pain in left foot: Secondary | ICD-10-CM

## 2023-04-15 DIAGNOSIS — E782 Mixed hyperlipidemia: Secondary | ICD-10-CM | POA: Diagnosis not present

## 2023-04-15 DIAGNOSIS — Z0000 Encounter for general adult medical examination without abnormal findings: Secondary | ICD-10-CM

## 2023-04-15 LAB — CBC WITH DIFFERENTIAL/PLATELET
Basophils Absolute: 0 10*3/uL (ref 0.0–0.1)
Basophils Relative: 0.4 % (ref 0.0–3.0)
Eosinophils Absolute: 0.2 10*3/uL (ref 0.0–0.7)
Eosinophils Relative: 3.1 % (ref 0.0–5.0)
HCT: 42.2 % (ref 39.0–52.0)
Hemoglobin: 13.7 g/dL (ref 13.0–17.0)
Lymphocytes Relative: 26.5 % (ref 12.0–46.0)
Lymphs Abs: 1.3 10*3/uL (ref 0.7–4.0)
MCHC: 32.5 g/dL (ref 30.0–36.0)
MCV: 90.4 fl (ref 78.0–100.0)
Monocytes Absolute: 0.4 10*3/uL (ref 0.1–1.0)
Monocytes Relative: 7.8 % (ref 3.0–12.0)
Neutro Abs: 3.1 10*3/uL (ref 1.4–7.7)
Neutrophils Relative %: 62.2 % (ref 43.0–77.0)
Platelets: 273 10*3/uL (ref 150.0–400.0)
RBC: 4.66 Mil/uL (ref 4.22–5.81)
RDW: 14.9 % (ref 11.5–15.5)
WBC: 5 10*3/uL (ref 4.0–10.5)

## 2023-04-15 LAB — COMPREHENSIVE METABOLIC PANEL
ALT: 13 U/L (ref 0–53)
AST: 16 U/L (ref 0–37)
Albumin: 4.3 g/dL (ref 3.5–5.2)
Alkaline Phosphatase: 64 U/L (ref 39–117)
BUN: 16 mg/dL (ref 6–23)
CO2: 30 mEq/L (ref 19–32)
Calcium: 9.9 mg/dL (ref 8.4–10.5)
Chloride: 102 mEq/L (ref 96–112)
Creatinine, Ser: 1.23 mg/dL (ref 0.40–1.50)
GFR: 65.13 mL/min (ref >60.00)
Glucose, Bld: 90 mg/dL (ref 70–99)
Potassium: 5 mEq/L (ref 3.5–5.1)
Sodium: 141 mEq/L (ref 135–145)
Total Bilirubin: 0.4 mg/dL (ref 0.2–1.2)
Total Protein: 7.2 g/dL (ref 6.0–8.3)

## 2023-04-15 LAB — PSA: PSA: 0.74 ng/mL (ref 0.10–4.00)

## 2023-04-15 LAB — TSH: TSH: 3.57 u[IU]/mL (ref 0.35–5.50)

## 2023-04-15 LAB — LIPID PANEL
Cholesterol: 210 mg/dL — ABNORMAL HIGH (ref 0–200)
HDL: 42.8 mg/dL (ref >39.00)
LDL Cholesterol: 152 mg/dL — ABNORMAL HIGH (ref 0–99)
NonHDL: 166.77
Total CHOL/HDL Ratio: 5
Triglycerides: 74 mg/dL (ref 0.0–149.0)
VLDL: 14.8 mg/dL (ref 0.0–40.0)

## 2023-04-15 LAB — MICROALBUMIN / CREATININE URINE RATIO
Creatinine,U: 164.8 mg/dL
Microalb Creat Ratio: 0.4 mg/g (ref 0.0–30.0)
Microalb, Ur: <0.7 mg/dL (ref 0.0–1.9)

## 2023-04-15 LAB — T4, FREE: Free T4: 0.8 ng/dL (ref 0.60–1.60)

## 2023-04-15 LAB — HEMOGLOBIN A1C: Hgb A1c MFr Bld: 6 % (ref 4.6–6.5)

## 2023-04-15 MED ORDER — OZEMPIC (2 MG/DOSE) 8 MG/3ML ~~LOC~~ SOPN
2.0000 mg | PEN_INJECTOR | SUBCUTANEOUS | 3 refills | Status: DC
Start: 2023-07-14 — End: 2024-08-11

## 2023-04-15 MED ORDER — SEMAGLUTIDE (1 MG/DOSE) 4 MG/3ML ~~LOC~~ SOPN: 1.0000 mg | PEN_INJECTOR | SUBCUTANEOUS | 2 refills | Status: DC

## 2023-04-15 NOTE — Progress Notes (Signed)
Established Patient Office Visit   Subjective  Patient ID: Leonard Rojas, male    DOB: 06-11-1966  Age: 57 y.o. MRN: [id number]  No chief complaint on file.   Patient is a 57 year old male seen for CPE.  Patient states he has been doing well, busy with work.  Patient notes left lateral foot pain times a few months.  Patient states that time pain started no injury noted.  Still able to complete daily activities but notices discomfort with certain movements.  Not currently taking any pain.  Tried KT tape and topical medications.  Patient currently taking Ozempic 0.5 mg weekly.  Denies GI symptoms.    Past Medical History:  Diagnosis Date   Diabetes mellitus type II    FHx: allergies    tb skin test   Hyperlipidemia    Hypertension    Obese    Sleep apnea    borderline-does use CPAP occ.    Past Surgical History:  Procedure Laterality Date   COLONOSCOPY     WISDOM TOOTH EXTRACTION     Social History   Tobacco Use   Smoking status: Never   Smokeless tobacco: Never  Vaping Use   Vaping status: Never Used  Substance Use Topics   Alcohol use: No   Drug use: No   Family History  Problem Relation Age of Onset   Other Mother        pre-diabetic   Diabetes Father    Hyperlipidemia Father    Hypertension Father    Prostate cancer Father    Diabetes Maternal Grandmother    Diabetes Maternal Grandfather    Diabetes Paternal Grandmother    Colon cancer Neg Hx    Esophageal cancer Neg Hx    Stomach cancer Neg Hx    No Known Allergies    ROS Negative unless stated above    Objective:     BP 118/86 (BP Location: Left Arm, Patient Position: Sitting, Cuff Size: Large)   Pulse 82   Temp 98.3 F (36.8 C) (Oral)   Ht 5\' 11"  (1.803 m)   Wt 263 lb 3.2 oz (119.4 kg)   SpO2 100%   BMI 36.71 kg/m    Physical Exam Constitutional:      Appearance: Normal appearance.  HENT:     Head: Normocephalic and atraumatic.     Right Ear: Tympanic membrane, ear canal and  external ear normal.     Left Ear: Tympanic membrane, ear canal and external ear normal.     Nose: Nose normal.     Mouth/Throat:     Mouth: Mucous membranes are moist.     Pharynx: No oropharyngeal exudate or posterior oropharyngeal erythema.  Eyes:     General: No scleral icterus.    Extraocular Movements: Extraocular movements intact.     Conjunctiva/sclera: Conjunctivae normal.     Pupils: Pupils are equal, round, and reactive to light.  Neck:     Thyroid: No thyromegaly.  Cardiovascular:     Rate and Rhythm: Normal rate and regular rhythm.     Pulses: Normal pulses.     Heart sounds: Normal heart sounds. No murmur heard.    No friction rub.     Comments: Normal DP and PT pulses. Pulmonary:     Effort: Pulmonary effort is normal.     Breath sounds: Normal breath sounds. No wheezing, rhonchi or rales.  Abdominal:     General: Bowel sounds are normal.     Palpations: Abdomen is  soft.     Tenderness: There is no abdominal tenderness.  Musculoskeletal:        General: No deformity. Normal range of motion.     Comments: TTP of left lateral distal ankle.  No pain with inversion or eversion, plantarflexion of foot.  Mild discomfort with dorsiflexion.  No edema, erythema, or deformity noted.  Lymphadenopathy:     Cervical: No cervical adenopathy.  Skin:    General: Skin is warm and dry.     Findings: No lesion.  Neurological:     General: No focal deficit present.     Mental Status: He is alert and oriented to person, place, and time.  Psychiatric:        Mood and Affect: Mood normal.        Thought Content: Thought content normal.      No results found for any visits on 04/15/23.    Assessment & Plan:  Well adult exam  Essential hypertension -     CBC with Differential/Platelet -     TSH -     T4, free  Left foot pain -     DG Ankle Complete Left  Prostate cancer screening -     PSA  Diabetes 1.5, managed as type 2 (HCC) -     Hemoglobin A1c -     Lipid  panel -     Microalbumin / creatinine urine ratio -     Semaglutide (1 MG/DOSE); Inject 1 mg as directed once a week.  Dispense: 3 mL; Refill: 2 -     Ozempic (2 MG/DOSE); Inject 2 mg into the skin once a week.  Dispense: 3 mL; Refill: 3  Mixed hyperlipidemia -     Lipid panel -     Comprehensive metabolic panel  Encounter for hepatitis C screening test for low risk patient -     Hepatitis C antibody  Age-appropriate health screenings discussed.  Obtain labs.  Colonoscopy up-to-date, done 02/05/2018.  Continue current medications.  Obtain x-ray of left foot due to continued pain.  Continue supportive care including stretching, heat, elevation, compression, etc. prescription for Ozempic 1 mg weekly sent to pharmacy.  If tolerated will increase to 2 mg weekly.   Return in about 3 months (around 07/16/2023), or if symptoms worsen or fail to improve, for diabetes.   Deeann Saint, MD

## 2023-04-15 NOTE — Patient Instructions (Addendum)
Prescription for Ozempic 1 mg weekly was sent to your pharmacy with 2 refills.  Another prescription for Ozempic 2 mg was also sent to your pharmacy but should not be sent to you until October.

## 2023-04-16 LAB — HEPATITIS C ANTIBODY: Hepatitis C Ab: NONREACTIVE

## 2023-04-17 ENCOUNTER — Ambulatory Visit (AMBULATORY_SURGERY_CENTER): Payer: Federal, State, Local not specified - PPO | Admitting: Gastroenterology

## 2023-04-17 ENCOUNTER — Encounter: Payer: Self-pay | Admitting: Gastroenterology

## 2023-04-17 VITALS — BP 155/87 | HR 80 | Temp 97.8°F | Resp 20 | Ht 71.0 in | Wt 263.0 lb

## 2023-04-17 DIAGNOSIS — K625 Hemorrhage of anus and rectum: Secondary | ICD-10-CM | POA: Diagnosis not present

## 2023-04-17 DIAGNOSIS — K635 Polyp of colon: Secondary | ICD-10-CM | POA: Diagnosis not present

## 2023-04-17 DIAGNOSIS — R1032 Left lower quadrant pain: Secondary | ICD-10-CM | POA: Diagnosis not present

## 2023-04-17 DIAGNOSIS — K5909 Other constipation: Secondary | ICD-10-CM

## 2023-04-17 MED ORDER — SODIUM CHLORIDE 0.9 % IV SOLN: 500.0000 mL | Freq: Once | INTRAVENOUS | Status: DC

## 2023-04-17 NOTE — Progress Notes (Unsigned)
Provation downtime brief colonoscopy note   LLQ pain , rectal bleeding , constipation  Diminutive TA polyp May 2019 Christella Hartigan)  Recent clinic note  Redundant colon, Internal HR  No polyps  Recall colon 10 years  Continue miralax  Scope #[id number] Scope in 15:39 Cecum 15:47 Scope out 1557   - Amada Jupiter, MD    [facility]

## 2023-04-17 NOTE — Patient Instructions (Signed)

## 2023-04-17 NOTE — Progress Notes (Unsigned)
Uneventful anesthetic. Report to pacu rn. Vss. Care resumed by rn. 

## 2023-04-17 NOTE — Progress Notes (Unsigned)
Last dose of Ozempic 04-04-23.  Pt's blood sugar is 72- he states that is what he normally runs about that number and he is not having any feelings of low blood sugar.  CRNA is aware

## 2023-04-17 NOTE — Progress Notes (Signed)
No changes to clinical history since GI office visit on 03/20/23.  The patient is appropriate for an endoscopic procedure in the ambulatory setting.  - Amada Jupiter, MD

## 2023-04-20 ENCOUNTER — Telehealth: Payer: Self-pay

## 2023-04-20 NOTE — Telephone Encounter (Signed)
  Follow up Call-     04/17/2023    3:17 PM  Call back number  Post procedure Call Back phone  # [phone]  Permission to leave phone message Yes     Post op call attempted, no answer, left WM.

## 2023-04-20 NOTE — Op Note (Addendum)
[facility] Patient Name: Leonard Rojas Procedure Date: 04/20/2023 4:43 PM MRN: [id number] Endoscopist: Sherilyn Cooter L. Myrtie Neither , MD, [id number] Age: 57 Referring MD:  Date of Birth: [birth year] Gender: Male Account #: [id number] Procedure:                Colonoscopy Indications:              Abdominal pain in the left lower quadrant, Rectal                            bleeding, Constipation                           patient had a diminutive TA polyp May 2019 (Dr.                            Christella Hartigan) Medicines:                Monitored Anesthesia Care Procedure:                Pre-Anesthesia Assessment:                           - Prior to the procedure, a History and Physical                            was performed, and patient medications and                            allergies were reviewed. The patient's tolerance of                            previous anesthesia was also reviewed. The risks                            and benefits of the procedure and the sedation                            options and risks were discussed with the patient.                            All questions were answered, and informed consent                            was obtained. Prior Anticoagulants: The patient has                            taken no anticoagulant or antiplatelet agents. ASA                            Grade Assessment: II - A patient with mild systemic                            disease. After reviewing the risks and benefits,  the patient was deemed in satisfactory condition to                            undergo the procedure.                           After obtaining informed consent, the colonoscope                            was passed under direct vision. Throughout the                            procedure, the patient's blood pressure, pulse, and                            oxygen saturations were monitored continuously. The                            CF  [id number] #[id number] was introduced through the anus                            and advanced to the the cecum, identified by                            appendiceal orifice and ileocecal valve. The                            colonoscopy was performed with some difficulty due                            to a redundant colon (loop reduction performed and                            abdominal pressure applied). The patient tolerated                            the procedure well. The quality of the bowel                            preparation was good. The ileocecal valve,                            appendiceal orifice, and rectum were seen but could                            not be photographed due to a technical problem with                            Provation. Manual times: Scope in 15:39, cecum                            15:47, scope out 15:57. Findings:                 The perianal  and digital rectal examinations were                            normal.                           Repeat examination of right colon under NBI                            performed.                           The sigmoid colon was redundant.                           Internal hemorrhoids were found.                           The exam was otherwise without abnormality on                            direct and retroflexion views. Complications:            No immediate complications. Estimated Blood Loss:     Estimated blood loss: none. Impression:               - Redundant colon.                           - Internal hemorrhoids.                           - The examination was otherwise normal on direct                            and retroflexion views.                           - No specimens collected. Recommendation:           - Patient has a contact number available for                            emergencies. The signs and symptoms of potential                            delayed complications were discussed with the                             patient. Return to normal activities tomorrow.                            Written discharge instructions were provided to the                            patient.                           - Resume previous diet.                           -  Continue present medications, including the                            miralax reportedly working well for constipation.                           - Repeat colonoscopy in 10 years for screening                            purposes.                           - Return to my office PRN. Della Homan L. Myrtie Neither, MD 04/20/2023 4:50:10 PM This report has been signed electronically.

## 2023-04-30 ENCOUNTER — Telehealth: Payer: Self-pay | Admitting: Family Medicine

## 2023-04-30 NOTE — Telephone Encounter (Signed)
Pt is returning call concerning blood work result

## 2023-05-04 NOTE — Telephone Encounter (Signed)
Notes show pt was contacted by Lindie Spruce, CMA to discuss results.

## 2023-06-21 DIAGNOSIS — T7840XA Allergy, unspecified, initial encounter: Secondary | ICD-10-CM | POA: Diagnosis not present

## 2023-06-21 DIAGNOSIS — X58XXXA Exposure to other specified factors, initial encounter: Secondary | ICD-10-CM | POA: Diagnosis not present

## 2023-06-21 DIAGNOSIS — R000 Tachycardia, unspecified: Secondary | ICD-10-CM | POA: Diagnosis not present

## 2023-06-21 DIAGNOSIS — R220 Localized swelling, mass and lump, head: Secondary | ICD-10-CM | POA: Diagnosis not present

## 2023-06-21 DIAGNOSIS — Z7985 Long-term (current) use of injectable non-insulin antidiabetic drugs: Secondary | ICD-10-CM | POA: Diagnosis not present

## 2023-06-21 DIAGNOSIS — I10 Essential (primary) hypertension: Secondary | ICD-10-CM | POA: Diagnosis not present

## 2023-06-21 DIAGNOSIS — T782XXA Anaphylactic shock, unspecified, initial encounter: Secondary | ICD-10-CM | POA: Diagnosis not present

## 2023-06-21 DIAGNOSIS — L299 Pruritus, unspecified: Secondary | ICD-10-CM | POA: Diagnosis not present

## 2023-07-15 ENCOUNTER — Other Ambulatory Visit (HOSPITAL_COMMUNITY): Payer: Self-pay

## 2023-07-24 ENCOUNTER — Telehealth: Payer: Self-pay

## 2023-07-24 ENCOUNTER — Other Ambulatory Visit (HOSPITAL_COMMUNITY): Payer: Self-pay

## 2023-07-24 NOTE — Telephone Encounter (Signed)
Pharmacy Patient Advocate Encounter   Received notification from CoverMyMeds that prior authorization for Ozempic (2 MG/DOSE) 8MG /3ML pen-injectors is required/requested.   Insurance verification completed.   The patient is insured through [employer] [facility] .   Per test claim: PA required; PA submitted to [employer] [facility] via CoverMyMeds Key/confirmation #/EOC [id number] Status is pending

## 2023-07-28 NOTE — Telephone Encounter (Signed)
Pharmacy Patient Advocate Encounter  Received notification from [employer] [facility] that Prior Authorization for Ozempic (2 MG/DOSE) 8MG /3ML pen-injectors has been DENIED.  Full denial letter will be uploaded to the media tab. See denial reason below.  The information submitted did not meet the plan's criteria for medical necessity due  to the following reason:  The use of this medication for any diagnosis other than type 2 diabetes mellitus (T2DM) is considered an experimental or investigational use for this drug or product.   PA #/Case ID/Reference #: [id number]  Please be advised we currently do not have a Pharmacist to review denials, therefore you will need to process appeals accordingly as needed. Thanks for your support at this time. Contact for appeals are as follows: Phone: [phone], Fax: [phone]

## 2023-07-30 DIAGNOSIS — H43391 Other vitreous opacities, right eye: Secondary | ICD-10-CM | POA: Diagnosis not present

## 2023-07-30 DIAGNOSIS — E119 Type 2 diabetes mellitus without complications: Secondary | ICD-10-CM | POA: Diagnosis not present

## 2023-09-18 ENCOUNTER — Encounter: Payer: Self-pay | Admitting: Family Medicine

## 2023-09-18 NOTE — Telephone Encounter (Signed)
 Care team updated and letter sent for eye exam notes.

## 2024-06-27 NOTE — Progress Notes (Signed)
 Leonard Rojas                                          MRN: [id number]   06/27/2024   The [facility] Quality Team Specialist reviewed this patient medical record for the purposes of chart review for care gap closure. The following were reviewed: chart review for care gap closure-kidney health evaluation for diabetes:eGFR  and uACR. No labs in 2025    [facility] Quality Team

## 2024-08-11 ENCOUNTER — Ambulatory Visit (INDEPENDENT_AMBULATORY_CARE_PROVIDER_SITE_OTHER): Admitting: Family Medicine

## 2024-08-11 ENCOUNTER — Encounter: Payer: Self-pay | Admitting: Family Medicine

## 2024-08-11 VITALS — BP 154/98 | HR 92 | Temp 97.9°F | Ht 71.0 in | Wt 295.6 lb

## 2024-08-11 DIAGNOSIS — Z23 Encounter for immunization: Secondary | ICD-10-CM | POA: Diagnosis not present

## 2024-08-11 DIAGNOSIS — R079 Chest pain, unspecified: Secondary | ICD-10-CM | POA: Diagnosis not present

## 2024-08-11 DIAGNOSIS — E782 Mixed hyperlipidemia: Secondary | ICD-10-CM

## 2024-08-11 DIAGNOSIS — E139 Other specified diabetes mellitus without complications: Secondary | ICD-10-CM | POA: Diagnosis not present

## 2024-08-11 DIAGNOSIS — Z125 Encounter for screening for malignant neoplasm of prostate: Secondary | ICD-10-CM | POA: Diagnosis not present

## 2024-08-11 DIAGNOSIS — I10 Essential (primary) hypertension: Secondary | ICD-10-CM | POA: Diagnosis not present

## 2024-08-11 DIAGNOSIS — Z0001 Encounter for general adult medical examination with abnormal findings: Secondary | ICD-10-CM | POA: Diagnosis not present

## 2024-08-11 DIAGNOSIS — G4733 Obstructive sleep apnea (adult) (pediatric): Secondary | ICD-10-CM

## 2024-08-11 DIAGNOSIS — Z87892 Personal history of anaphylaxis: Secondary | ICD-10-CM

## 2024-08-11 LAB — COMPREHENSIVE METABOLIC PANEL WITH GFR
ALT: 24 U/L (ref 0–53)
AST: 16 U/L (ref 0–37)
Albumin: 4.3 g/dL (ref 3.5–5.2)
Alkaline Phosphatase: 67 U/L (ref 39–117)
BUN: 12 mg/dL (ref 6–23)
CO2: 30 meq/L (ref 19–32)
Calcium: 9.3 mg/dL (ref 8.4–10.5)
Chloride: 99 meq/L (ref 96–112)
Creatinine, Ser: 1 mg/dL (ref 0.40–1.50)
GFR: 82.72 mL/min (ref >60.00)
Glucose, Bld: 190 mg/dL — ABNORMAL HIGH (ref 70–99)
Potassium: 4.5 meq/L (ref 3.5–5.1)
Sodium: 138 meq/L (ref 135–145)
Total Bilirubin: 0.4 mg/dL (ref 0.2–1.2)
Total Protein: 7.3 g/dL (ref 6.0–8.3)

## 2024-08-11 LAB — LIPID PANEL
Cholesterol: 231 mg/dL — ABNORMAL HIGH (ref 0–200)
HDL: 45.6 mg/dL (ref >39.00)
LDL Cholesterol: 168 mg/dL — ABNORMAL HIGH (ref 0–99)
NonHDL: 185.74
Total CHOL/HDL Ratio: 5
Triglycerides: 89 mg/dL (ref 0.0–149.0)
VLDL: 17.8 mg/dL (ref 0.0–40.0)

## 2024-08-11 LAB — CBC WITH DIFFERENTIAL/PLATELET
Basophils Absolute: 0 K/uL (ref 0.0–0.1)
Basophils Relative: 0.4 % (ref 0.0–3.0)
Eosinophils Absolute: 0.3 K/uL (ref 0.0–0.7)
Eosinophils Relative: 4.9 % (ref 0.0–5.0)
HCT: 42.3 % (ref 39.0–52.0)
Hemoglobin: 13.9 g/dL (ref 13.0–17.0)
Lymphocytes Relative: 24.4 % (ref 12.0–46.0)
Lymphs Abs: 1.3 K/uL (ref 0.7–4.0)
MCHC: 32.8 g/dL (ref 30.0–36.0)
MCV: 89.4 fl (ref 78.0–100.0)
Monocytes Absolute: 0.5 K/uL (ref 0.1–1.0)
Monocytes Relative: 9.4 % (ref 3.0–12.0)
Neutro Abs: 3.1 K/uL (ref 1.4–7.7)
Neutrophils Relative %: 60.9 % (ref 43.0–77.0)
Platelets: 227 K/uL (ref 150.0–400.0)
RBC: 4.73 Mil/uL (ref 4.22–5.81)
RDW: 14.3 % (ref 11.5–15.5)
WBC: 5.1 K/uL (ref 4.0–10.5)

## 2024-08-11 LAB — HEMOGLOBIN A1C: Hgb A1c MFr Bld: 10.3 % — ABNORMAL HIGH (ref 4.6–6.5)

## 2024-08-11 LAB — MICROALBUMIN / CREATININE URINE RATIO
Creatinine,U: 136.1 mg/dL
Microalb Creat Ratio: UNDETERMINED mg/g (ref 0.0–30.0)
Microalb, Ur: <0.7 mg/dL

## 2024-08-11 LAB — T4, FREE: Free T4: 0.75 ng/dL (ref 0.60–1.60)

## 2024-08-11 LAB — PSA: PSA: 0.59 ng/mL (ref 0.10–4.00)

## 2024-08-11 LAB — TSH: TSH: 3.43 u[IU]/mL (ref 0.35–5.50)

## 2024-08-11 LAB — MAGNESIUM: Magnesium: 1.7 mg/dL (ref 1.5–2.5)

## 2024-08-11 MED ORDER — LISINOPRIL 5 MG PO TABS: 5.0000 mg | ORAL_TABLET | Freq: Every day | ORAL | 1 refills | Status: AC

## 2024-08-11 MED ORDER — EPINEPHRINE 0.3 MG/0.3ML IJ SOAJ: 0.3000 mg | INTRAMUSCULAR | 2 refills | Status: AC | PRN

## 2024-08-11 NOTE — Progress Notes (Signed)
 Established Patient Office Visit   Subjective  Patient ID: Leonard Rojas, male    DOB: December 08, 1965  Age: 58 y.o. 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  Pt mentions intermittent L sided CP x 1.5-2 wks.  Typically occurs at rest.  Noted as a dull ache/throb.  Occasionally aspirin helps with discomfort.  Also tried Tums during episode.  Rarely having heartburn.  Denies SOB, n/v, wheezing, cough, headaches, fever, chills, pain that radiates.  Are not really checking blood sugar.  Was well on keto diet but stopped 2 weeks ago.  Does not really have time to exercise due to work schedule other than yard work.  Drinking 40 ounces of water per day 2 cups of coffee, 1 cup of iced tea, 1-2 Sprite zeros or Sunkist zeros per day.  Was checking BP at home, readings 140s/80s-90s.  No longer wearing CPAP times months as mask was uncomfortable.  Purchased nasal pillow but has not used.  Patient inquires about medication to help with diabetes and weight loss.  Previously on Ozempic  but had to stop after having an anaphylactic allergic reaction.  Seen at [facility].  Unsure if reaction was due to Ozempic  or being bit by a fire ant.  Given an EpiPen.  States was referred to allergist but appointment was canceled and not rescheduled.  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    Past Surgical History:  Procedure Laterality Date   COLONOSCOPY     WISDOM TOOTH EXTRACTION     Social History   Tobacco Use   Smoking status: Never   Smokeless tobacco: Never  Vaping Use   Vaping status: Never Used  Substance Use Topics   Alcohol use: No   Drug use: No   Family History  Problem Relation Age of Onset   Other Mother        pre-diabetic   Diabetes Father    Hyperlipidemia Father    Hypertension Father    Prostate cancer Father    Diabetes Maternal Grandmother    Diabetes Maternal Grandfather    Diabetes Paternal Grandmother    Colon cancer Neg Hx    Esophageal cancer Neg Hx    Stomach cancer Neg Hx    Rectal cancer Neg Hx    No Known Allergies  ROS Negative unless stated above    Objective:     BP (!) 154/92 (BP Location: Left Arm, Patient Position: Sitting, Cuff Size: Large)   Pulse 92  Temp 97.9 F (36.6 C) (Oral)   Ht 5' 11 (1.803 m)   Wt 295 lb 9.6 oz (134.1 kg)   SpO2 100%   BMI 41.23 kg/m  BP Readings from Last 3 Encounters:  08/11/24 (!) 154/92  04/17/23 (!) 155/87  04/15/23 118/86   Wt Readings from Last 3 Encounters:  08/11/24 295 lb 9.6 oz (134.1 kg)  04/17/23 263 lb (119.3 kg)  04/15/23 263 lb 3.2 oz (119.4 kg)      Physical Exam Constitutional:      Appearance: Normal appearance.  HENT:     Head: Normocephalic and atraumatic.     Right Ear: Tympanic membrane, ear canal and external ear normal.     Left Ear: Tympanic membrane, ear canal and external ear normal.     Nose: Nose normal.     Mouth/Throat:     Mouth: Mucous membranes are moist.     Pharynx: No oropharyngeal exudate or posterior oropharyngeal erythema.  Eyes:     General: No scleral icterus.    Extraocular Movements: Extraocular movements  intact.     Conjunctiva/sclera: Conjunctivae normal.     Pupils: Pupils are equal, round, and reactive to light.  Neck:     Thyroid : No thyromegaly.     Vascular: No carotid bruit.  Cardiovascular:     Rate and Rhythm: Normal rate and regular rhythm.     Pulses: Normal pulses.     Heart sounds: Normal heart sounds. No murmur heard.    No friction rub.  Pulmonary:     Effort: Pulmonary effort is normal.     Breath sounds: Normal breath sounds. No wheezing, rhonchi or rales.  Abdominal:     General: Bowel sounds are normal.     Palpations: Abdomen is soft.     Tenderness: There is no abdominal tenderness.  Musculoskeletal:        General: No deformity. Normal range of motion.  Lymphadenopathy:     Cervical: No cervical adenopathy.  Skin:    General: Skin is warm and dry.     Findings: No lesion.  Neurological:     General: No focal deficit present.     Mental Status: He is alert and oriented to person, place, and time.  Psychiatric:        Mood and Affect: Mood normal.        Thought Content: Thought content normal.    Diabetic Foot Exam - Simple   Simple Foot Form Diabetic Foot exam was performed with the following findings: Yes 08/11/2024  8:58 AM  Visual Inspection No deformities, no ulcerations, no other skin breakdown bilaterally: Yes See comments: Yes Sensation Testing Intact to touch and monofilament testing bilaterally: Yes See comments: Yes Pulse Check Posterior Tibialis and Dorsalis pulse intact bilaterally: Yes Comments Calloused heels b/l with dry areas of skin on plantar surface of feet.  Mild-moderately decreased vibratory sense of R foot.        08/11/2024    9:23 AM 04/15/2023    8:10 AM 07/04/2022    7:57 AM  Depression screen PHQ 2/9  Decreased Interest 0 0 0  Down, Depressed, Hopeless 0 0 0  PHQ - 2 Score 0 0 0  Altered sleeping 0 0 0  Tired, decreased energy 1 2 0  Change in appetite 0 0 0  Feeling bad or failure about yourself  0 0 0   Trouble concentrating 1 0 0  Moving slowly or fidgety/restless 0 0 0  Suicidal thoughts  0  0  PHQ-9 Score 2 2  0   Difficult doing work/chores Not difficult at all Somewhat difficult Not difficult at all     Data saved with a previous flowsheet row definition      08/11/2024    9:23 AM 04/15/2023    8:11 AM  GAD 7 : Generalized Anxiety Score  Nervous, Anxious, on Edge 0 0  Control/stop worrying 0 0  Worry too much - different things 0 0  Trouble relaxing 1 0  Restless 0 0  Easily annoyed or irritable 0 0  Afraid - awful might happen 0 0  Total GAD 7 Score 1 0  Anxiety Difficulty Not difficult at all      No results found for any visits on 08/11/24.    Assessment & Plan:   Encounter for well adult exam with abnormal findings -     CBC with Differential/Platelet; Future -     Comprehensive metabolic panel with GFR; Future -     Hemoglobin A1c; Future -     Lipid panel; Future -     TSH; Future -     T4, free; Future  Chest pain, unspecified type -     CBC with Differential/Platelet; Future -     Comprehensive metabolic panel with GFR; Future -     Lipid panel; Future -     EKG 12-Lead -     Ambulatory referral to Cardiology -     Magnesium; Future  Essential hypertension -     Comprehensive metabolic panel with GFR; Future -     TSH; Future -     T4, free; Future -     Lisinopril ; Take 1 tablet (5 mg total) by mouth daily.  Dispense: 90 tablet; Refill: 1 -     Pulmonary Visit  Mixed hyperlipidemia -     Lipid panel; Future  Diabetes 1.5, managed as type 2 (HCC) -     Hemoglobin A1c; Future -     Microalbumin / creatinine urine ratio; Future  Need for tetanus booster -     Tdap vaccine greater than or equal to 7yo IM  Screening for prostate cancer -     PSA; Future  History of anaphylaxis -     Ambulatory referral to Allergy -     EPINEPHrine; Inject 0.3 mg into the muscle as needed for anaphylaxis.  Dispense: 2 each; Refill: 2  OSA (obstructive  sleep apnea) -     Pulmonary Visit  Age.  Health screenings discussed.  Obtain labs.  Immunizations reviewed.  Tdap given this visit.  Colonoscopy up-to-date done 04/20/2023.    BP uncontrolled this visit.  Restart lisinopril  5 mg daily.  Lifestyle modification strongly encouraged.  Patient to monitor BP at home and keep a log of readings to bring to next appointment in 4 weeks.  Patient advised to start wearing CPAP.  Referral to pulm for sleep study as last done ~8 yrs ago.  Intermittent CP.  Currently without symptoms.  EKG this visit with NSR, HR 70s, slow R wave progression.  No T wave inversion.  Referral to cards for stress test.  DM 2 manage is 1.5.  Obtain hemoglobin A1c.  Restart medication based on lab results.  Referral to allergy testing regarding anaphylactic reaction to Ozempic  versus antibiotics.  EpiPen refilled.  Discussed the importance of lifestyle modifications.  Tdap given this visit.  Will likely need to start cholesterol med based on lab results.  I  personally spent a total of 43 minutes in the care of the patient today including getting/reviewing separately obtained history, performing a medically appropriate exam/evaluation, counseling and educating, placing orders, referring and communicating with other health care professionals, documenting clinical information in the EHR, independently interpreting results, communicating results, and coordinating care.  Return in about 4 weeks (around 09/08/2024) for chronic conditions, blood pressure, diabetes.   Clotilda JONELLE Single, MD

## 2024-08-11 NOTE — Patient Instructions (Addendum)
 Referral to the cardiologist for stress test was placed.  A referral to allergy for allergy testing was also placed.  They will call you about setting up these appointments.  A refill on lisinopril  was sent to your pharmacy.  You can start taking this for your blood pressure.  Once we have the labs a prescription for diabetes medication to be sent to your pharmacy.  We will have you follow-up in the next 4 weeks in clinic.  Keep a log of your blood pressure readings to bring with you.

## 2024-08-12 ENCOUNTER — Ambulatory Visit: Attending: Cardiology | Admitting: Nurse Practitioner

## 2024-08-12 VITALS — BP 142/80 | HR 80 | Ht 71.0 in | Wt 295.2 lb

## 2024-08-12 DIAGNOSIS — I10 Essential (primary) hypertension: Secondary | ICD-10-CM

## 2024-08-12 DIAGNOSIS — E782 Mixed hyperlipidemia: Secondary | ICD-10-CM

## 2024-08-12 DIAGNOSIS — R072 Precordial pain: Secondary | ICD-10-CM

## 2024-08-12 DIAGNOSIS — E119 Type 2 diabetes mellitus without complications: Secondary | ICD-10-CM | POA: Diagnosis not present

## 2024-08-12 DIAGNOSIS — G4733 Obstructive sleep apnea (adult) (pediatric): Secondary | ICD-10-CM

## 2024-08-12 DIAGNOSIS — E7849 Other hyperlipidemia: Secondary | ICD-10-CM

## 2024-08-12 MED ORDER — METOPROLOL TARTRATE 100 MG PO TABS: 100.0000 mg | ORAL_TABLET | Freq: Once | ORAL | 0 refills | Status: DC

## 2024-08-12 NOTE — Patient Instructions (Signed)
 Medication Instructions:  Your physician recommends that you continue on your current medications as directed. Please refer to the Current Medication list given to you today.   *If you need a refill on your cardiac medications before your next appointment, please call your pharmacy*  Lab Work: None ordered  If you have labs (blood work) drawn today and your tests are completely normal, you will receive your results only by: MyChart Message (if you have MyChart) OR A paper copy in the mail If you have any lab test that is abnormal or we need to change your treatment, we will call you to review the results.  Testing/Procedures: Your physician has requested that you have an echocardiogram. Echocardiography is a painless test that uses sound waves to create images of your heart. It provides your doctor with information about the size and shape of your heart and how well your heart's chambers and valves are working. This procedure takes approximately one hour. There are no restrictions for this procedure. Please do NOT wear cologne, perfume, aftershave, or lotions (deodorant is allowed). Please arrive 15 minutes prior to your appointment time.  Please note: We ask at that you not bring children with you during ultrasound (echo/ vascular) testing. Due to room size and safety concerns, children are not allowed in the ultrasound rooms during exams. Our front office staff cannot provide observation of children in our lobby area while testing is being conducted. An adult accompanying a patient to their appointment will only be allowed in the ultrasound room at the discretion of the ultrasound technician under special circumstances. We apologize for any inconvenience.     Your physician has requested that you have cardiac CT. Cardiac computed tomography (CT) is a painless test that uses an x-ray machine to take clear, detailed pictures of your heart. For further information please visit  [email or web address] Please follow instruction sheet BELOW:    Your cardiac CT will be scheduled at one of the below locations:   [facility] [address] [place], [place] [address] [phone] (Severe contrast allergies only)  OR   [facility] [address] [place], [place] [address] [phone]  OR   [facility] [address] [place], [place] [address] [phone]  OR   [facility] and Vascular Tower [address]  [place], [place] [address]  OR   [facility] [address] [place], [place] [phone]  If scheduled at [facility], please arrive at the [facility] and Children's Entrance (Entrance C2) of [facility] 30 minutes prior to test start time. You can use the FREE valet parking offered at entrance C (encouraged to control the heart rate for the test)  Proceed to the [facility] Radiology Department (first floor) to check-in and test prep.  All radiology patients and guests should use entrance C2 at [facility], accessed from [facility], even though the hospital's physical address listed is [address].  If scheduled at the Heart and Vascular Tower at [address], please enter the parking lot using the [address] entrance and use the FREE valet service at the patient drop-off area. Enter the building and check-in with registration on the main floor.  If scheduled at [facility], please arrive to the Heart and Vascular Center 15 mins early for check-in and test prep.  There is spacious parking and easy access to the radiology department from the [facility] Heart and  Vascular entrance. Please enter here and check-in with the desk attendant.   If scheduled at [facility], please arrive 30 minutes early for check-in and test prep.  Please follow these instructions carefully (unless otherwise directed):  An  IV will be required for this test and Nitroglycerin will be given.  Hold all erectile dysfunction medications at least 3 days (72 hrs) prior to test. (Ie viagra, cialis, sildenafil, tadalafil, etc)   On the Night Before the Test: Be sure to Drink plenty of water. Do not consume any caffeinated/decaffeinated beverages or chocolate 12 hours prior to your test. Do not take any antihistamines 12 hours prior to your test.   On the Day of the Test: Drink plenty of water until 1 hour prior to the test. Do not eat any food 1 hour prior to test. You may take your regular medications prior to the test.  Take metoprolol (Lopressor) 100 MG  two hours prior to test. THIS HAS BEEN SENT TO        After the Test: Drink plenty of water. After receiving IV contrast, you may experience a mild flushed feeling. This is normal. On occasion, you may experience a mild rash up to 24 hours after the test. This is not dangerous. If this occurs, you can take Benadryl  25 mg, Zyrtec, Claritin, or Allegra and increase your fluid intake. (Patients taking Tikosyn should avoid Benadryl , and may take Zyrtec, Claritin, or Allegra) If you experience trouble breathing, this can be serious. If it is severe call 911 IMMEDIATELY. If it is mild, please call our office.  We will call to schedule your test 2-4 weeks out understanding that some insurance companies will need an authorization prior to the service being performed.   For more information and frequently asked questions, please visit our website : [email or web address]  For non-scheduling related questions, please contact the cardiac imaging nurse navigator should you have any questions/concerns: Cardiac Imaging Nurse Navigators Direct Office Dial: [phone]   For scheduling needs, including cancellations and rescheduling, please call Brittany, [phone].    You have been referred to PHARM-D FOR LIPIDS  Follow-Up: At [facility], you  and your health needs are our priority.  As part of our continuing mission to provide you with exceptional heart care, our providers are all part of one team.  This team includes your primary Cardiologist (physician) and Advanced Practice Providers or APPs (Physician Assistants and Nurse Practitioners) who all work together to provide you with the care you need, when you need it.  Your next appointment:   6-8  week(s)  Provider:   Georganna Archer, MD or Damien Braver, NP          We recommend signing up for the patient portal called MyChart.  Sign up information is provided on this After Visit Summary.  MyChart is used to connect with patients for Virtual Visits (Telemedicine).  Patients are able to view lab/test results, encounter notes, upcoming appointments, etc.  Non-urgent messages can be sent to your provider as well.   To learn more about what you can do with MyChart, go to [email or web address].   Other Instructions

## 2024-08-12 NOTE — Progress Notes (Signed)
 Office Visit    Patient Name: Leonard Rojas Date of Encounter: 08/12/2024  Primary Care Provider:  Mercer Clotilda SAUNDERS, MD Primary Cardiologist:  None  Chief Complaint    58 year old male with a history of chest pain, hypertension, hyperlipidemia, type 2 diabetes, and OSA who presents for heart first clinic new patient evaluation.  Past Medical History    Past Medical History:  Diagnosis Date   Diabetes mellitus type II    FHx: allergies    tb skin test   Hyperlipidemia    Hypertension    Obese    Sleep apnea    borderline-does use CPAP occ.    Past Surgical History:  Procedure Laterality Date   COLONOSCOPY     WISDOM TOOTH EXTRACTION      Allergies  Allergies  Allergen Reactions   Ozempic  (0.25 Or 0.5 Mg-Dose) [Semaglutide (0.25 Or 0.5mg -Dos)] Anaphylaxis     Labs/Other Studies Reviewed    The following studies were reviewed today:     Recent Labs: 08/11/2024: ALT 24; BUN 12; Creatinine, Ser 1.00; Hemoglobin 13.9; Magnesium 1.7; Platelets 227.0; Potassium 4.5; Sodium 138; TSH 3.43  Recent Lipid Panel    Component Value Date/Time   CHOL 231 (H) 08/11/2024 0916   TRIG 89.0 08/11/2024 0916   HDL 45.60 08/11/2024 0916   CHOLHDL 5 08/11/2024 0916   VLDL 17.8 08/11/2024 0916   LDLCALC 168 (H) 08/11/2024 0916   LDLDIRECT 150.0 11/03/2013 0838    History of Present Illness    58 year old male with the above past medical history including chest pain, hypertension, hyperlipidemia, type 2 diabetes, and OSA.  He saw his PCP on 08/11/2024 and reported intermittent chest pain.  BP was elevated.  He was restarted on lisinopril .  EKG showed sinus rhythm, 70 bpm, poor R wave progression.  He was referred to cardiology.  Additionally, he was referred to pulmonology for ongoing management of OSA, adherence to CPAP was advised.  He presents today for heart first clinic new patient evaluation.  He reports a prior ETT greater than 20 years ago showed possible hypertensive  response to exercise. He reports a 2-week history of  nonexertional mid/upper chest discomfort, which he describes as an aching, tightness, that occasionally radiates to his back, last for seconds and resolve spontaneously.  He denies exertional chest pain. Additionally, he reports a recent episode of anaphylaxis of unclear cause.,  He states that he took his GLP-1 receptor agonist that day, he thinks he was also bit by an insect, he also ate shrimp the same day.  BP has been elevated.  He has not yet started taking his lisinopril .  He drinks 2-3 servings of caffeine a day, reports routine EtOH use.  He does not exercise.  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  He denies any significant family history of heart disease.  Home Medications    Current Outpatient Medications  Medication Sig Dispense Refill   aspirin 81 MG tablet Take 160 mg by mouth as needed for pain.     EPINEPHrine 0.3 mg/0.3 mL IJ SOAJ injection Inject 0.3 mg into the muscle as needed for anaphylaxis. 2 each 2   glucose blood (ONE TOUCH TEST STRIPS) test strip Use daily to test glucose 100 each 3   glucose blood (ONE TOUCH TEST STRIPS) test strip Use as instructed 100 each 3   lisinopril  (ZESTRIL ) 5 MG tablet Take 1 tablet (5 mg total) by mouth daily. 90 tablet 1   No current facility-administered  medications for this visit.     Review of Systems    He denies palpitations, dyspnea, pnd, orthopnea, n, v, dizziness, syncope, edema, weight gain, or early satiety. All other systems reviewed and are otherwise negative except as noted above.   Physical Exam    VS:  BP (!) 142/80   Pulse 80   Ht 5' 11 (1.803 m)   Wt 295 lb 3.2 oz (133.9 kg)   SpO2 98%   BMI 41.17 kg/m   GEN: Well nourished, well developed, in no acute distress. HEENT: normal. Neck: Supple, no JVD, carotid bruits, or masses. Cardiac: RRR, no murmurs, rubs, or gallops. No clubbing, cyanosis, edema.  Radials/DP/PT 2+ and equal bilaterally.   Respiratory:  Respirations regular and unlabored, clear to auscultation bilaterally. GI: Soft, nontender, nondistended, BS + x 4. MS: no deformity or atrophy. Skin: warm and dry, no rash. Neuro:  Strength and sensation are intact. Psych: Normal affect.  Accessory Clinical Findings    ECG personally reviewed by me today -    - no EKG in office today. EKG from 08/11/2024 reviewed-NSR, 72 bpm, no acute ST/T wave changes. Lab Results  Component Value Date   WBC 5.1 08/11/2024   HGB 13.9 08/11/2024   HCT 42.3 08/11/2024   MCV 89.4 08/11/2024   PLT 227.0 08/11/2024   Lab Results  Component Value Date   CREATININE 1.00 08/11/2024   BUN 12 08/11/2024   NA 138 08/11/2024   K 4.5 08/11/2024   CL 99 08/11/2024   CO2 30 08/11/2024   Lab Results  Component Value Date   ALT 24 08/11/2024   AST 16 08/11/2024   ALKPHOS 67 08/11/2024   BILITOT 0.4 08/11/2024   Lab Results  Component Value Date   CHOL 231 (H) 08/11/2024   HDL 45.60 08/11/2024   LDLCALC 168 (H) 08/11/2024   LDLDIRECT 150.0 11/03/2013   TRIG 89.0 08/11/2024   CHOLHDL 5 08/11/2024    Lab Results  Component Value Date   HGBA1C 10.3 (H) 08/11/2024    Assessment & Plan    1. Chest pain: He reports a 2-week history of intermittent nonexertional mid/upper chest discomfort, which he describes as an aching, tightness, that occasionally radiates to his back, last for seconds and resolve spontaneously.  He denies exertional chest pain.  Symptoms are somewhat atypical.  However, given multiple risk factors, and for further risk stratification, will pursue coronary CT angiogram.  Recent BMET stable.  Will check echocardiogram.  Reviewed ED precautions.  Continue aspirin, lisinopril .  2. Hypertension: BP elevated in office today.  He was recently prescribed lisinopril  per PCP, however, he has not started taking the medication.  Encouraged him to begin taking lisinopril  5 mg daily.  Continue to monitor BP and report BP greater  than 130/80 mmHg.  If BP remains elevated above goal, consider escalation of antihypertensive medication.  3. Hyperlipidemia: LDL was 168 in 07/2024.  Has a history of statin intolerance. The 10-year ASCVD risk score (Arnett DK, et al., 2019) is: 28.2%.  Will refer to Pharm.D. for consideration of PCSK9 inhibitor.  4. Type 2 diabetes: A1c was 10.3 in 07/2024.  Monitored and managed per PCP.  5. OSA: Encouraged adherence to CPAP.  Pulmonology referral for ongoing management.  6. Disposition: Follow-up in 6 to 8 weeks with Dr. Floretta or APP.     Damien JAYSON Braver, NP 08/12/2024, 11:18 AM

## 2024-08-14 ENCOUNTER — Ambulatory Visit: Payer: Self-pay | Admitting: Family Medicine

## 2024-08-14 DIAGNOSIS — E1165 Type 2 diabetes mellitus with hyperglycemia: Secondary | ICD-10-CM

## 2024-08-14 DIAGNOSIS — E782 Mixed hyperlipidemia: Secondary | ICD-10-CM

## 2024-08-14 MED ORDER — DAPAGLIFLOZIN PROPANEDIOL 5 MG PO TABS: 5.0000 mg | ORAL_TABLET | Freq: Every day | ORAL | 3 refills | Status: AC

## 2024-08-14 MED ORDER — ATORVASTATIN CALCIUM 20 MG PO TABS: 20.0000 mg | ORAL_TABLET | Freq: Every day | ORAL | 3 refills | Status: DC

## 2024-08-15 ENCOUNTER — Encounter: Payer: Self-pay | Admitting: Nurse Practitioner

## 2024-08-16 ENCOUNTER — Ambulatory Visit: Attending: Internal Medicine | Admitting: Pharmacist Clinician (PhC)/ Clinical Pharmacy Specialist

## 2024-08-16 ENCOUNTER — Telehealth: Payer: Self-pay | Admitting: Pharmacist Clinician (PhC)/ Clinical Pharmacy Specialist

## 2024-08-16 ENCOUNTER — Telehealth: Payer: Self-pay | Admitting: Pharmacy Technician

## 2024-08-16 ENCOUNTER — Other Ambulatory Visit (HOSPITAL_COMMUNITY): Payer: Self-pay

## 2024-08-16 ENCOUNTER — Encounter: Payer: Self-pay | Admitting: Pharmacist Clinician (PhC)/ Clinical Pharmacy Specialist

## 2024-08-16 DIAGNOSIS — E78019 Familial hypercholesterolemia, unspecified: Secondary | ICD-10-CM | POA: Diagnosis not present

## 2024-08-16 NOTE — Assessment & Plan Note (Signed)
 Assessment: Patient with familial hyperlipidemia not at LDL goal of < 70 Most recent LDL 168 on 08/11/24 Not able to tolerate statins secondary to myalgias - atorvastatin , simvastatin, - myalgias Reviewed options for lowering LDL cholesterol, including PCSK-9 inhibitors and inclisiran.  Discussed mechanisms of action, dosing, side effects, potential decreases in LDL cholesterol and costs.  Also reviewed potential options for patient assistance.  Plan: Patient agreeable to starting Repatha 140 mg q14d Repeat labs after:  3 months Lipid Liver function Patient was given information on Repatha copay card

## 2024-08-16 NOTE — Progress Notes (Signed)
 Office Visit    Patient Name: Leonard Rojas Date of Encounter: 08/16/2024  Primary Care Provider:  Mercer Clotilda SAUNDERS, MD Primary Cardiologist:  Georganna Archer, MD  Chief Complaint    Hyperlipidemia - familial  Significant Past Medical History   HTN Elevated at last visit, on   DM2 11/25 A1c 10.3  OSA Mild, uses CPAP occasionally  Obesity BMI 41.17,  poss anaphylactic rxn to semaglutide         Allergies  Allergen Reactions   Ozempic  (0.25 Or 0.5 Mg-Dose) [Semaglutide (0.25 Or 0.5mg -Dos)] Anaphylaxis   Metformin  And Related Diarrhea    GI intolerance    History of Present Illness    Leonard Rojas is a 58 y.o. male patient of Dr. Georganna Archer, in the office today to discuss options for cholesterol management.  Insurance Carrier: [employer] FEP   LDL Cholesterol goal:  < 70  Current Medications:  none   Previously tried:  atorvastatin , simvastatin,  red yeast rice - myalgias  Family Hx:  parents still living (91 and 61) in good health;  mother on Repatha; no siblings, 1 son now 63    Social Hx: Tobacco:no Alcohol: occasional  Diet:  eats out regularly; some fried foods, mostly fast food at lunch while working (on the road during the day); not many vegetables, does eat salad regularly; doesn't snack much    Exercise: no   Accessory Clinical Findings   Lab Results  Component Value Date   CHOL 231 (H) 08/11/2024   HDL 45.60 08/11/2024   LDLCALC 168 (H) 08/11/2024   LDLDIRECT 150.0 11/03/2013   TRIG 89.0 08/11/2024   CHOLHDL 5 08/11/2024    No results found for: LIPOA  Lab Results  Component Value Date   ALT 24 08/11/2024   AST 16 08/11/2024   ALKPHOS 67 08/11/2024   BILITOT 0.4 08/11/2024   Lab Results  Component Value Date   CREATININE 1.00 08/11/2024   BUN 12 08/11/2024   NA 138 08/11/2024   K 4.5 08/11/2024   CL 99 08/11/2024   CO2 30 08/11/2024   Lab Results  Component Value Date   HGBA1C 10.3 (H) 08/11/2024    Home Medications     Current Outpatient Medications  Medication Sig Dispense Refill   aspirin 81 MG tablet Take 160 mg by mouth as needed for pain.     atorvastatin  (LIPITOR) 20 MG tablet Take 1 tablet (20 mg total) by mouth daily. 90 tablet 3   dapagliflozin propanediol (FARXIGA) 5 MG TABS tablet Take 1 tablet (5 mg total) by mouth daily. 90 tablet 3   EPINEPHrine 0.3 mg/0.3 mL IJ SOAJ injection Inject 0.3 mg into the muscle as needed for anaphylaxis. 2 each 2   glucose blood (ONE TOUCH TEST STRIPS) test strip Use daily to test glucose 100 each 3   glucose blood (ONE TOUCH TEST STRIPS) test strip Use as instructed 100 each 3   lisinopril  (ZESTRIL ) 5 MG tablet Take 1 tablet (5 mg total) by mouth daily. 90 tablet 1   metoprolol tartrate (LOPRESSOR) 100 MG tablet Take 1 tablet (100 mg total) by mouth once for 1 dose. Take 90-120 minutes prior to scan. Hold for SBP less than 110. 1 tablet 0   No current facility-administered medications for this visit.     Assessment & Plan    Hyperlipidemia Assessment: Patient with familial hyperlipidemia not at LDL goal of < 70 Most recent LDL 168 on 08/11/24 Not able to tolerate statins secondary to  myalgias - atorvastatin , simvastatin, - myalgias Reviewed options for lowering LDL cholesterol, including PCSK-9 inhibitors and inclisiran.  Discussed mechanisms of action, dosing, side effects, potential decreases in LDL cholesterol and costs.  Also reviewed potential options for patient assistance.  Plan: Patient agreeable to starting Repatha 140 mg q14d Repeat labs after:  3 months Lipid Liver function Patient was given information on Repatha copay card   Leonard Rojas, PharmD CPP [facility] [address]   [place], [place] [address] [phone]  08/16/2024, 9:52 AM

## 2024-08-16 NOTE — Telephone Encounter (Signed)
 Pharmacy Patient Advocate Encounter   Received notification from Physician's Office that prior authorization for repatha is required/requested.   Insurance verification completed.   The patient is insured through [employer].   Per test claim: PA required; PA submitted to above mentioned insurance via Latent Key/confirmation #/EOC [id number] Status is pending

## 2024-08-16 NOTE — Telephone Encounter (Signed)
 Please do PA for Repatha - familial hyperlipidemia.

## 2024-08-16 NOTE — Patient Instructions (Signed)
 Your Results:             Your most recent labs Goal  Total Cholesterol 231 < 200  Triglycerides 89 < 150  HDL (happy/good cholesterol) 45.6 > 40  LDL (lousy/bad cholesterol 168 < 70   Medication changes:  We will start the process to get Repatha covered by your insurance.  Once the prior authorization is complete, I will call/send a MyChart message to let you know and confirm pharmacy information.   You will take one injection every 14 days  Lab orders:  We want to repeat labs after 2-3 months.  We will send you a lab order to remind you once we get closer to that time.    Patient Assistance:    Repatha Co-pay Card Instructions 1.      Go to [email or web address]  2.      Click the red "Repahta Co-Pay Card" button near the top right 3.      Scroll down and click "Yes, I have a Repatha prescription 4.      Continue to scroll down and selected "[employer]"  5.      Continue to scroll down and for the question "Are you eligible for Medicare but receiving prescription drug coverage from a former employer, union, or welfare plan?" select "No" 6.      Continue to scroll down and click the first box which is next to "Repatha Co-Pay Card, and beneath that, select "I want to enroll in the Repatha Co-Pay Card Program 7.      Continue to scroll down until you see the "Next" button, then click it 8.      Fill out your personal information then click the "Next" button    Thank you for choosing [facility]

## 2024-08-17 NOTE — Telephone Encounter (Signed)
 Pharmacy Patient Advocate Encounter  Received notification from [facility] that Prior Authorization for repatha has been DENIED.  Full denial letter will be uploaded to the media tab. See denial reason below.   PA #/Case ID/Reference #: [id number]

## 2024-08-22 NOTE — Telephone Encounter (Signed)
 ,

## 2024-08-29 ENCOUNTER — Other Ambulatory Visit (HOSPITAL_COMMUNITY): Payer: Self-pay

## 2024-08-29 NOTE — Telephone Encounter (Signed)
 Can we try an appeal?  Patient has history of familial hyperlipidemia, as determined by industry standards.  His LDL has been as high as 206, without medication in the past.  The FDA approved labeling for Repatha is for reducing LDL cholesterol in adult patients with heterozygous familial hypercholesterolemia as well as to reduce risk of major adverse CV events in adults at increased risk for these events.  The most recent data from the VESALIUS-CV trial showed that for patients with DM2 or atherosclerosis (Mr. Wainer is diabetic), use of Repatha resulted in a 25% relative risk reduction in 3 point MACE.    He has tried and failed two different statins, as well as OTC red yeast rice for LDL lowering.  Mr Ballen has an ASCVD risk score of 28.2%, putting him in the high risk category.    For you to limit the use of Repatha to solely patients with known homozygous familial hypercholesterolemia is severely compromising your patient population in their desire to stay healthy.  It will end up costing you more in the long run, when patients like Mr Aloia are hospitalized with MI, stroke or need for coronary revascularization.    Please reconsider you previous decision.

## 2024-08-30 ENCOUNTER — Other Ambulatory Visit (HOSPITAL_COMMUNITY): Payer: Self-pay

## 2024-08-30 NOTE — Telephone Encounter (Signed)
 Pharmacy Patient Advocate Encounter  Received notification from [facility] that Prior Authorization for repatha has been APPROVED from 07/30/24 to 08/29/25. Ran test claim, Copay is $88.20- one month. This test claim was processed through [facility]- copay amounts may vary at other pharmacies due to pharmacy/plan contracts, or as the patient moves through the different stages of their insurance plan.   PA #/Case ID/Reference #: attached in media

## 2024-09-02 ENCOUNTER — Encounter (HOSPITAL_COMMUNITY): Payer: Self-pay

## 2024-09-06 ENCOUNTER — Ambulatory Visit (HOSPITAL_COMMUNITY)
Admission: RE | Admit: 2024-09-06 | Discharge: 2024-09-06 | Attending: Nurse Practitioner | Admitting: Nurse Practitioner

## 2024-09-06 DIAGNOSIS — R072 Precordial pain: Secondary | ICD-10-CM | POA: Diagnosis not present

## 2024-09-06 MED ORDER — IOHEXOL 350 MG/ML SOLN
100.0000 mL | Freq: Once | INTRAVENOUS | Status: AC | PRN
  Administered 2024-09-06: 100 mL via INTRAVENOUS

## 2024-09-06 MED ORDER — NITROGLYCERIN 0.4 MG SL SUBL
0.8000 mg | SUBLINGUAL_TABLET | Freq: Once | SUBLINGUAL | Status: AC
  Administered 2024-09-06: 0.8 mg via SUBLINGUAL

## 2024-09-07 ENCOUNTER — Ambulatory Visit: Payer: Self-pay | Admitting: Nurse Practitioner

## 2024-09-08 ENCOUNTER — Ambulatory Visit: Admitting: Family Medicine

## 2024-09-14 ENCOUNTER — Ambulatory Visit: Admitting: Family Medicine

## 2024-09-14 ENCOUNTER — Encounter: Payer: Self-pay | Admitting: Family Medicine

## 2024-09-14 ENCOUNTER — Encounter: Payer: Self-pay | Admitting: Pharmacist Clinician (PhC)/ Clinical Pharmacy Specialist

## 2024-09-14 VITALS — BP 132/82 | HR 85 | Temp 98.5°F | Ht 71.0 in | Wt 294.4 lb

## 2024-09-14 DIAGNOSIS — E1165 Type 2 diabetes mellitus with hyperglycemia: Secondary | ICD-10-CM | POA: Diagnosis not present

## 2024-09-14 DIAGNOSIS — T466X5A Adverse effect of antihyperlipidemic and antiarteriosclerotic drugs, initial encounter: Secondary | ICD-10-CM | POA: Insufficient documentation

## 2024-09-14 DIAGNOSIS — I10 Essential (primary) hypertension: Secondary | ICD-10-CM

## 2024-09-14 DIAGNOSIS — M791 Myalgia, unspecified site: Secondary | ICD-10-CM

## 2024-09-14 DIAGNOSIS — E782 Mixed hyperlipidemia: Secondary | ICD-10-CM | POA: Diagnosis not present

## 2024-09-14 DIAGNOSIS — E78019 Familial hypercholesterolemia, unspecified: Secondary | ICD-10-CM

## 2024-09-14 MED ORDER — REPATHA SURECLICK 140 MG/ML ~~LOC~~ SOAJ: 140.0000 mg | SUBCUTANEOUS | 3 refills | Status: AC

## 2024-09-14 MED ORDER — DEXCOM G7 RECEIVER DEVI: 1.0000 | 0 refills | Status: AC

## 2024-09-14 MED ORDER — DEXCOM G7 SENSOR MISC: 1.0000 | 11 refills | Status: AC

## 2024-09-14 NOTE — Progress Notes (Signed)
 Established Patient Office Visit   Subjective  Patient ID: Leonard Rojas, male    DOB: 10-Jun-1966  Age: 58 y.o. MRN: [id number]  Chief Complaint  Patient presents with   Medical Management of Chronic Issues    Patient is here for a 4 week follow up for Bp and DM    Patient is a 58 year old male seen for follow-up on chronic conditions.  Patient states he has not been checking BP or blood sugar at home.  Taking Farxiga  5 mg most days.  Last A1c 10.3% on 08/11/2024.  States has been eating more sweets is on vacation this last few weeks.   not taking Lipitor 20 mg daily 2/2 myalgias.  In the past red yeast rice, and 2 other statins caused myalgias.  Does not recall the names.  States Repatha  was denied by his insurance, then he received a letter stating it was approved.  Followed by cardiology.  Inquires about restarting Ozempic  however question of anaphylactic reaction to it versus an ant bite.  Has not scheduled follow-up with allergist for testing.  Has eye exam scheduled January 2025.    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    Past Surgical History:  Procedure Laterality Date   COLONOSCOPY     WISDOM TOOTH EXTRACTION     Social History[1] Family History  Problem Relation Age of Onset   Other Mother        pre-diabetic   Diabetes Father    Hyperlipidemia Father    Hypertension  Father    Prostate cancer Father    Diabetes Maternal Grandmother    Diabetes Maternal Grandfather    Diabetes Paternal Grandmother    Colon cancer Neg Hx    Esophageal cancer Neg Hx    Stomach cancer Neg Hx    Rectal cancer Neg Hx    Allergies[2]  ROS Negative unless stated above    Objective:     BP 132/82 (BP Location: Left Arm, Patient Position: Sitting, Cuff Size: Large)   Pulse 85   Temp 98.5 F (36.9 C) (Oral)   Ht 5' 11 (1.803 m)   Wt 294 lb 6.4 oz (133.5 kg)   SpO2 97%   BMI 41.06 kg/m  BP Readings from Last 3 Encounters:  09/14/24 132/82  09/06/24 122/81  08/12/24 (!) 142/80   Wt Readings from Last 3 Encounters:  09/14/24 294 lb 6.4 oz (133.5 kg)  08/12/24 295 lb 3.2 oz (133.9 kg)  08/11/24 295 lb 9.6 oz (134.1 kg)      Physical Exam Constitutional:      General: He is not in acute distress.    Appearance: Normal appearance.  HENT:     Head: Normocephalic and atraumatic.     Nose: Nose normal.     Mouth/Throat:     Mouth:  Mucous membranes are moist.  Cardiovascular:     Rate and Rhythm: Normal rate and regular rhythm.     Heart sounds: Normal heart sounds. No murmur heard.    No gallop.  Pulmonary:     Effort: Pulmonary effort is normal. No respiratory distress.     Breath sounds: Normal breath sounds. No wheezing, rhonchi or rales.  Skin:    General: Skin is warm and dry.  Neurological:     Mental Status: He is alert and oriented to person, place, and time.        08/11/2024    9:23 AM 04/15/2023    8:10 AM 07/04/2022    7:57 AM  Depression screen PHQ 2/9  Decreased Interest 0 0 0  Down, Depressed, Hopeless 0 0 0  PHQ - 2 Score 0 0 0  Altered sleeping 0 0 0  Tired, decreased energy 1 2 0  Change in appetite 0 0 0  Feeling bad or failure about yourself  0 0 0  Trouble concentrating 1 0 0  Moving slowly or fidgety/restless 0 0 0  Suicidal thoughts 0  0  PHQ-9 Score 2 2  0   Difficult doing work/chores Not difficult at all  Somewhat difficult Not difficult at all     Data saved with a previous flowsheet row definition      08/11/2024    9:23 AM 04/15/2023    8:11 AM  GAD 7 : Generalized Anxiety Score  Nervous, Anxious, on Edge 0 0  Control/stop worrying 0 0  Worry too much - different things 0 0  Trouble relaxing 1 0  Restless 0 0  Easily annoyed or irritable 0 0  Afraid - awful might happen 0 0  Total GAD 7 Score 1 0  Anxiety Difficulty Not difficult at all      No results found for any visits on 09/14/24.    Assessment & Plan:   Uncontrolled type 2 diabetes mellitus with hyperglycemia, without long-term current use of insulin (HCC) -     Dexcom G7 Sensor; 1 Device by Does not apply route every 14 (fourteen) days.  Dispense: 2 each; Refill: 11 -     Dexcom G7 Receiver; 1 Device by Does not apply route as directed.  Dispense: 1 each; Refill: 0  Mixed hyperlipidemia  Essential hypertension  Myalgia due to statin  A1c 10.3% on 08/11/2024.  Discussed the importance of lifestyle modifications and monitoring blood sugar.  Will see if insurance will cover CGM.  Patient advised Farxiga  alone is not likely to control diabetes.  Continue to avoid GLP-1's due to question of anaphylaxis with Ozempic .  Allergy testing advised. Has epi pen.  Try Lipitor 20 mg 2 times per week with co-Q10 to see if tolerated.  Question on if Repatha  was actually approved.  Can check with pharmacy.  BP improving.  Continue lifestyle modifications.  Taking lisinopril  5 mg daily.    Return in about 2 months (around 11/15/2024) for blood pressure, diabetes.   Clotilda JONELLE Single, MD    [1]  Social History Tobacco Use   Smoking status: Never   Smokeless tobacco: Never  Vaping Use   Vaping status: Never Used  Substance Use Topics   Alcohol use: No   Drug use: No  [2]  Allergies Allergen Reactions   Ozempic  (0.25 Or 0.5 Mg-Dose) [Semaglutide (0.25 Or 0.5mg -Dos)] Anaphylaxis   Metformin  And Related Diarrhea    GI  intolerance

## 2024-09-14 NOTE — Addendum Note (Signed)
 Addended by: Nikkie Liming L on: 09/14/2024 08:53 AM   Modules accepted: Orders

## 2024-09-14 NOTE — Telephone Encounter (Signed)
Rx sent, patient notified via MyChart

## 2024-09-14 NOTE — Patient Instructions (Signed)
 You can try taking the cholesterol medicine twice a week with co-Q10.  Co-Q10 can be found over-the-counter at your local drugstore.

## 2024-09-15 ENCOUNTER — Telehealth: Payer: Self-pay

## 2024-09-15 ENCOUNTER — Other Ambulatory Visit (HOSPITAL_COMMUNITY): Payer: Self-pay

## 2024-09-15 NOTE — Telephone Encounter (Signed)
 Pharmacy Patient Advocate Encounter   Received notification from Onbase that prior authorization for Dexcom G7 sensors is required/requested.   Insurance verification completed.   The patient is insured through [employer] [facility].

## 2024-09-15 NOTE — Telephone Encounter (Signed)
 Pharmacy Patient Advocate Encounter   Received notification from Onbase that prior authorization for Dexcom G7 receiver is required/requested.   Insurance verification completed.   The patient is insured through [employer].   Per test claim: PA required; PA submitted to above mentioned insurance via Latent Key/confirmation #/EOC [id number] Status is pending

## 2024-09-20 ENCOUNTER — Ambulatory Visit (HOSPITAL_COMMUNITY)
Admission: RE | Admit: 2024-09-20 | Discharge: 2024-09-20 | Disposition: A | Discharge status: Home / Self Care | Source: Ambulatory Visit | Attending: Internal Medicine | Admitting: Internal Medicine

## 2024-09-20 DIAGNOSIS — R079 Chest pain, unspecified: Secondary | ICD-10-CM

## 2024-09-20 DIAGNOSIS — R072 Precordial pain: Secondary | ICD-10-CM | POA: Insufficient documentation

## 2024-09-20 LAB — ECHOCARDIOGRAM COMPLETE
Area-P 1/2: 3.21 cm2
S' Lateral: 3.1 cm

## 2024-10-03 NOTE — Progress Notes (Signed)
 " Cardiology Office Note:   Date:  10/03/2024  ID:  Leonard Rojas, DOB September 15, 1966, MRN [id number] PCP: Mercer Clotilda SAUNDERS, MD  [facility] Providers Cardiologist:  Georganna Archer, MD { Chief Complaint:  Chief Complaint  Patient presents with   Hyperlipidemia      History of Present Illness:   Leonard Rojas is a 59 y.o. male with a PMH of HTN, familial hypercholesterolemia, DM2, OSA who presents for follow up.  Patient establish care with Damien Braver in November last year for the evaluation of chest pain.  Underwent a CCTA which showed very minimal nonobstructive CAD and an echocardiogram which was WNL.  He presents today for follow-up with me.  Of note the patient has had markedly elevated LDLs in the past with the highest being 206.  Most recent lipid panel showed an LDL 168.  Today the patient states that his chest pains have largely improved.  The only ever occurred at rest and were atypical.  Denies of the symptoms including SOB, palpitations, PND, orthopnea and syncope.  He was recently started on Repatha  by Pharm.D. for hyperlipidemia.   Past Medical History:  Diagnosis Date   Diabetes mellitus type II    FHx: allergies    tb skin test   Hyperlipidemia    Hypertension    Obese    Sleep apnea    borderline-does use CPAP occ.      Studies Reviewed:    EKG: No new ECG       Cardiac Studies & Procedures   ______________________________________________________________________________________________     ECHOCARDIOGRAM  ECHOCARDIOGRAM COMPLETE 09/20/2024  Narrative ECHOCARDIOGRAM REPORT    Patient Name:   Leonard Rojas Date of Exam: 09/20/2024 Medical Rec #:  [id number]       Height:       71.0 in Accession #:    [id number]      Weight:       294.4 lb Date of Birth:  [birth year]      BSA:          2.485 m Patient Age:    58 years        BP:           142/80 mmHg Patient Gender: M               HR:           76 bpm. Exam Location:  [address]  Procedure: 2D Echo, Cardiac Doppler and Color Doppler (Both Spectral and Color Flow Doppler were utilized during procedure).  Indications:    R07.9 Chest pain  History:        Patient has no prior history of Echocardiogram examinations. Signs/Symptoms:Chest Pain; Risk Factors:Hypertension, Diabetes, Dyslipidemia, Obesity and Sleep Apnea.  Sonographer:    Elsie Bohr RDCS Referring Phys: [phone] EMILY C MONGE   Sonographer Comments: Technically difficult study due to poor echo windows and patient is obese. IMPRESSIONS   1. Left ventricular ejection fraction, by estimation, is 60 to 65%. The left ventricle has normal function. The left ventricle has no regional wall motion abnormalities. There is mild concentric left ventricular hypertrophy. Left ventricular diastolic parameters were normal. 2. Right ventricular systolic function is normal. The right ventricular size is normal. 3. The mitral valve is normal in structure. Mild mitral valve regurgitation. No evidence of mitral stenosis. 4. The aortic valve is normal in structure. Aortic valve regurgitation is trivial. No aortic stenosis is present. 5. The inferior vena cava is  normal in size with greater than 50% respiratory variability, suggesting right atrial pressure of 3 mmHg.  FINDINGS Left Ventricle: Left ventricular ejection fraction, by estimation, is 60 to 65%. The left ventricle has normal function. The left ventricle has no regional wall motion abnormalities. The left ventricular internal cavity size was normal in size. There is mild concentric left ventricular hypertrophy. Left ventricular diastolic parameters were normal.  Right Ventricle: The right ventricular size is normal. No increase in right ventricular wall thickness. Right ventricular systolic function is normal.  Left Atrium: Left atrial size was normal in size.  Right Atrium: Right atrial size was normal in size.  Pericardium: There is no evidence of  pericardial effusion.  Mitral Valve: The mitral valve is normal in structure. Mild mitral valve regurgitation. No evidence of mitral valve stenosis.  Tricuspid Valve: The tricuspid valve is normal in structure. Tricuspid valve regurgitation is trivial. No evidence of tricuspid stenosis.  Aortic Valve: The aortic valve is normal in structure. Aortic valve regurgitation is trivial. No aortic stenosis is present.  Pulmonic Valve: The pulmonic valve was normal in structure. Pulmonic valve regurgitation is trivial. No evidence of pulmonic stenosis.  Aorta: The aortic root is normal in size and structure.  Venous: The inferior vena cava is normal in size with greater than 50% respiratory variability, suggesting right atrial pressure of 3 mmHg.  IAS/Shunts: No atrial level shunt detected by color flow Doppler.   LEFT VENTRICLE PLAX 2D LVIDd:         5.00 cm   Diastology LVIDs:         3.10 cm   LV e' medial:    7.40 cm/s LV PW:         1.10 cm   LV E/e' medial:  12.7 LV IVS:        1.20 cm   LV e' lateral:   9.90 cm/s LVOT diam:     2.10 cm   LV E/e' lateral: 9.5 LV SV:         67 LV SV Index:   27 LVOT Area:     3.46 cm LV IVRT:       79 msec   RIGHT VENTRICLE             IVC RV S prime:     14.80 cm/s  IVC diam: 1.60 cm TAPSE (M-mode): 2.1 cm PULMONARY VEINS Diastolic Velocity: 57.60 cm/s S/D Velocity:       1.40 Systolic Velocity:  79.30 cm/s  LEFT ATRIUM             Index        RIGHT ATRIUM           Index LA diam:        3.40 cm 1.37 cm/m   RA Pressure: 3.00 mmHg LA Vol (A2C):   53.9 ml 21.69 ml/m  RA Area:     14.70 cm LA Vol (A4C):   54.5 ml 21.93 ml/m  RA Volume:   39.40 ml  15.85 ml/m LA Biplane Vol: 53.9 ml 21.69 ml/m AORTIC VALVE LVOT Vmax:   102.00 cm/s LVOT Vmean:  64.600 cm/s LVOT VTI:    0.194 m  AORTA Ao Root diam: 3.00 cm Ao Asc diam:  3.00 cm  MITRAL VALVE               TRICUSPID VALVE MV Area (PHT): 3.21 cm    Estimated RAP:  3.00 mmHg MV  Decel Time: 236 msec MV  E velocity: 93.80 cm/s  SHUNTS MV A velocity: 86.50 cm/s  Systemic VTI:  0.19 m MV E/A ratio:  1.08        Systemic Diam: 2.10 cm  Toribio Fuel MD Electronically signed by Toribio Fuel MD Signature Date/Time: 09/20/2024/8:58:10 AM    Final      CT SCANS  CT CORONARY MORPH W/CTA COR W/SCORE 09/06/2024  Addendum 09/17/2024  3:53 PM ADDENDUM REPORT: 09/17/2024 15:51  EXAM: OVER-READ INTERPRETATION  CT CHEST  The following report is an over-read performed by radiologist Dr. Andrea Gasman of [facility] on 09/17/2024. This over-read does not include interpretation of cardiac or coronary anatomy or pathology. The coronary CTA interpretation by the cardiologist is attached.  COMPARISON:  None.  FINDINGS: Vascular: No aortic atherosclerosis. The included aorta is normal in caliber.  Mediastinum/nodes: No adenopathy or mass. Unremarkable esophagus.  Lungs: No focal airspace disease. No pulmonary nodule. No pleural fluid. The included airways are patent.  Upper abdomen: No acute or unexpected findings.  Musculoskeletal: There are no acute or suspicious osseous abnormalities.  IMPRESSION: No significant extracardiac findings.   Electronically Signed By: Andrea Gasman M.D. On: 09/17/2024 15:51  Narrative HISTORY: Chest pain, nonspecific  EXAM: Cardiac/Coronary  CT  PROTOCOL: A non-contrast, gated CT scan was obtained with axial slices of 2.5 mm through the heart for calcium  scoring. Calcium  scoring was performed using the Agatston method. A 120 kV prospective, gated, contrast cardiac CT scan was obtained. Gantry rotation speed was 230 msec and collimation was 0.63 mm. Two sublingual nitroglycerin  tablets (0.8 mg) were given. The 3D data set was reconstructed with motion correction for the best systolic or diastolic phase. Images were analyzed on a dedicated workstation using MPR, MIP, and VRT modes. The  patient received 95 cc of contrast.  FINDINGS: Image quality: Excellent.  Artifact: Limited.  Coronary calcium  score is 0.  Coronary arteries: Normal coronary origins.  Right dominance.  Left Main Coronary Artery: Normal caliber vessel, trifurcates into a left anterior descending artery (LAD), left circumflex artery (LCX), and ramus intermedius (RI). There is no plaque or stenosis.  Left Anterior Descending Artery: Normal caliber vessel, wraps the apex, gives off 1 diagonal branch. LAD is patent.  First Diagonal branch: Patent  Ramus intermedius: Small, not well visualized, grossly patent.  Left Circumflex Artery: Minimal (<24%) non-calcified plaque at the ostial LCx otherwise vessel is patent.  First Obtuse Marginal branch: Normal caliber, large size, patent  Right Coronary Artery: Dominant vessel, terminates as a PDA and right posterolateral branch. RCA is patent.  Aorta: Normal size, 29 mm at the mid ascending aorta (level of the PA bifurcation) measured double oblique.  Aortic Valve: Native valve, trileaflet aortic valve, no calcification.  Mitral valve: Native valve, no mitral annular calcification.  Other findings:  Normal pulmonary vein drainage into the left atrium.  Normal left atrial appendage without thrombus.  Normal size of the pulmonary artery.  Please see separate report from [facility] for non-cardiac findings.  IMPRESSION: 1. Coronary calcium  score of 0.  2. Normal coronary origins with right dominance.  3. CAD-RADS 1 Minimal non-obstructive CAD.  4. Please see separate report from [facility] for non-cardiac findings.  RECOMMENDATION: Consider non-atherosclerotic causes of chest pain. Consider preventive therapy and risk factor modification.  Electronically Signed: By: Madonna Large On: 09/06/2024 16:23     ______________________________________________________________________________________________      Risk  Assessment/Calculations:              Physical  Exam:     VS:  BP 117/64   Pulse 80   Ht 5' 11 (1.803 m)   Wt 292 lb 6.4 oz (132.6 kg)   SpO2 96%   BMI 40.78 kg/m      Wt Readings from Last 3 Encounters:  09/14/24 294 lb 6.4 oz (133.5 kg)  08/12/24 295 lb 3.2 oz (133.9 kg)  08/11/24 295 lb 9.6 oz (134.1 kg)     GEN: Well nourished, well developed, in no acute distress NECK: No JVD; No carotid bruits CARDIAC: RRR, no murmurs, rubs, gallops RESPIRATORY:  Clear to auscultation without rales, wheezing or rhonchi  ABDOMEN: Soft, non-tender, non-distended, normal bowel sounds EXTREMITIES:  Warm and well perfused, trivial peripheral edema bilaterally; No deformity, 2+ radial pulses PSYCH: Normal mood and affect   Assessment & Plan   #Chest Pain - CCTA and echocardiogram were largely unremarkable. - Noncardiac etiology to chest pain. CTM  #Probable Familial Hypercholesterolemia #HLD  #Statin Intolerance -The patient has had markedly elevated LDLs in the past >200. - Has tried statins previously but developed muscle aches and pains so statin intolerant. -Recently started Repatha  for better lipid management. -Given how high his LDL is I suspect he will quite reach his goal of <70 with Repatha  alone.  Will start Setia to use concomitantly. -Given the association of FH with LPA elevation I will also check this in 2 months with a repeat lipid panel check. Continue Repatha  Start Zetia  10 mg daily Repeat lipid panel in 2 months Check lipoprotein a in 2 months Follow-up in 12 months  #HTN -BP is at goal.  No changes. Continue lisinopril  5 mg daily           This note was written with the assistance of a dictation microphone or AI dictation software. Please excuse any typos or grammatical errors.   Signed, Georganna Archer, MD 10/03/2024 5:37 PM    [facility]  "

## 2024-10-04 ENCOUNTER — Ambulatory Visit: Attending: Cardiology | Admitting: Student in an Organized Health Care Education/Training Program

## 2024-10-04 ENCOUNTER — Encounter: Payer: Self-pay | Admitting: Student in an Organized Health Care Education/Training Program

## 2024-10-04 VITALS — BP 117/64 | HR 80 | Ht 71.0 in | Wt 292.4 lb

## 2024-10-04 DIAGNOSIS — E78019 Familial hypercholesterolemia, unspecified: Secondary | ICD-10-CM | POA: Diagnosis not present

## 2024-10-04 MED ORDER — EZETIMIBE 10 MG PO TABS: 10.0000 mg | ORAL_TABLET | Freq: Every day | ORAL | 3 refills | Status: AC

## 2024-10-04 NOTE — Patient Instructions (Signed)
 Medication Instructions:  START Zetia  10 mg daily   *If you need a refill on your cardiac medications before your next appointment, please call your pharmacy*  Lab Work in 2 months : Lipid panel  LP(a)  If you have labs (blood work) drawn today and your tests are completely normal, you will receive your results only by: MyChart Message (if you have MyChart) OR A paper copy in the mail If you have any lab test that is abnormal or we need to change your treatment, we will call you to review the results.  Follow-Up: At [facility], you and your health needs are our priority.  As part of our continuing mission to provide you with exceptional heart care, our providers are all part of one team.  This team includes your primary Cardiologist (physician) and Advanced Practice Providers or APPs (Physician Assistants and Nurse Practitioners) who all work together to provide you with the care you need, when you need it.  Your next appointment:   12 month(s)  Provider:   Georganna Archer, MD

## 2024-10-06 LAB — OPHTHALMOLOGY REPORT-SCANNED

## 2024-11-16 ENCOUNTER — Ambulatory Visit: Admitting: Family Medicine
